# Patient Record
Sex: Female | Born: 1990 | Race: Black or African American | Hispanic: No | Marital: Single | Smoking: Former smoker
Health system: Southern US, Community
[De-identification: ages and names within clinical notes are randomized; demographics above are authoritative.]

## PROBLEM LIST (undated history)

## (undated) DIAGNOSIS — D219 Benign neoplasm of connective and other soft tissue, unspecified: Secondary | ICD-10-CM

## (undated) DIAGNOSIS — Z789 Other specified health status: Secondary | ICD-10-CM

## (undated) HISTORY — PX: NO PAST SURGERIES: SHX2092

## (undated) HISTORY — DX: Benign neoplasm of connective and other soft tissue, unspecified: D21.9

---
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
## 2011-11-19 ENCOUNTER — Emergency Department (HOSPITAL_COMMUNITY)
Admission: EM | Admit: 2011-11-19 | Discharge: 2011-11-19 | Disposition: A | Discharge status: Home / Self Care | Payer: Self-pay | Attending: Emergency Medicine | Admitting: Emergency Medicine

## 2011-11-19 ENCOUNTER — Encounter (HOSPITAL_COMMUNITY): Payer: Self-pay | Admitting: *Deleted

## 2011-11-19 DIAGNOSIS — K5900 Constipation, unspecified: Secondary | ICD-10-CM | POA: Insufficient documentation

## 2011-11-19 LAB — URINALYSIS, ROUTINE W REFLEX MICROSCOPIC
Glucose, UA: NEGATIVE mg/dL
Ketones, ur: NEGATIVE mg/dL
Nitrite: NEGATIVE
Urobilinogen, UA: 1 mg/dL (ref 0.0–1.0)

## 2011-11-19 NOTE — ED Notes (Signed)
She drank a bottle of mag citrate yesterday and again today to cleanse her system.  She was told by her mother to call poison control and they sent her here.  She had a burning in her throat .  No abd pain

## 2011-11-19 NOTE — ED Notes (Signed)
Pt reports after the second dose of mag citrate, she started feeling weak and her throat started burning.  Pt reports that her speech started slurring and starting having watery stools.

## 2011-11-19 NOTE — Discharge Instructions (Signed)
Please read over the instructions below. There is no danger in the amount of magnesium citrate you took for your constipation. Consult with your pharmacist regarding over-the-counter remedies for constipation. Increase fruits and vegetables and be sure to drink plenty of water every day. If this is a intermittent problem for you may consider taking a daily stool softener like Colace or its generic equivalent. You can have up to 3 stool softeners a day. Refer to "[facility]" number above to assist you in getting established with a primary care physician. If your constipation persists you will need to arrange follow up with a primary care physician.    Constipation Constipation is when you poop (have a bowel movement) less than 3 times a week. Sometimes the poop (stool) is small and hard. There are many different causes of constipation.  HOME CARE   Go to the bathroom when you feel the urge to go.   Drink enough fluid to keep your pee (urine) clear or pale yellow.   Eat more high-fiber foods.   Drink prune juice or eat stewed fruits in the morning.   Exercise.   Ask your doctor if you should take medicine to help you poop or take fiber supplements.  GET HELP RIGHT AWAY IF:   You see blood in your poop or in the toilet.   Your belly (abdomen) gets hard and puffy (swollen).   You keep throwing up (vomiting).   You have a lot of pain.  MAKE SURE YOU:   Understand these instructions.   Will watch your condition.   Will get help right away if you are not doing well or get worse.  Document Released: 02/06/2008 Document Revised: 08/09/2011 Document Reviewed: 07/24/2011 [facility] Patient Information 2012 [place], [place].

## 2011-11-20 NOTE — ED Provider Notes (Signed)
History     CSN: [id number]  Arrival date & time 11/19/11  0043   First MD Initiated Contact with Patient 11/19/11 0141      Chief Complaint  Patient presents with  . ingestion mag citrate      Patient is a 21 y.o. female presenting with Ingested Medication. The history is provided by a parent.  Ingestion This is a new problem. The current episode started yesterday. The problem has been unchanged. Pertinent negatives include no chills, congestion, coughing, fever, rash, vomiting or weakness. She has tried nothing for the symptoms.   patient reports that she took an entire bottle of mag citrate on Saturday and then again on Sunday for persistent constipation. When her mother found out she insists that she call the [facility] and she felt like this was a possible overdose of magnesium citrate. When she called poison control they went her list of symptoms and told her she needed to come in to be evaluated. Patient states they told her she sounded as if she was slurring her speech the patient denies. Patient denies any other complaints.  History reviewed. No pertinent past medical history.  History reviewed. No pertinent past surgical history.  History reviewed. No pertinent family history.  History  Substance Use Topics  . Smoking status: Never Smoker   . Smokeless tobacco: Not on file  . Alcohol Use: No    OB History    Grav Para Term Preterm Abortions TAB SAB Ect Mult Living                  Review of Systems  Constitutional: Negative for fever and chills.  HENT: Negative.  Negative for congestion.   Eyes: Negative.   Respiratory: Negative for cough.   Cardiovascular: Negative.   Gastrointestinal: Negative.  Negative for vomiting.  Genitourinary: Negative.   Musculoskeletal: Negative.   Skin: Negative.  Negative for rash.  Neurological: Negative.  Negative for weakness.  Hematological: Negative.   Psychiatric/Behavioral: Negative.     Allergies  Review  of patient's allergies indicates no known allergies.  Home Medications   Current Outpatient Rx  Name Route Sig Dispense Refill  . MAGNESIUM CITRATE PO Oral Take 296 mLs by mouth daily as needed. For laxative      BP 114/72  Pulse 68  Temp(Src) 98.4 F (36.9 C) (Oral)  Resp 18  SpO2 100%  LMP 11/07/2011  Physical Exam  Constitutional: She is oriented to person, place, and time. She appears well-developed and well-nourished. She is active and cooperative.  Non-toxic appearance. She does not have a sickly appearance. She does not appear ill. No distress.  HENT:  Head: Normocephalic and atraumatic.  Right Ear: Hearing, tympanic membrane, external ear and ear canal normal.  Left Ear: Hearing, tympanic membrane, external ear and ear canal normal.  Nose: Nose normal.  Mouth/Throat: Uvula is midline.  Eyes: Conjunctivae are normal.  Neck: Neck supple.  Cardiovascular: Normal rate and regular rhythm.   Pulmonary/Chest: Effort normal and breath sounds normal.  Abdominal: Soft. Bowel sounds are normal.  Musculoskeletal: Normal range of motion.  Neurological: She is alert and oriented to person, place, and time.  Skin: Skin is warm and dry. No rash noted. No erythema.  Psychiatric: She has a normal mood and affect.    ED Course  Procedures  I have called poison control to ask why they recommended that patient come in for ingesting 2 bottles of mag magnesium citrate 24 hours apart. I spoke  with the representative who actually spoke with the patient and she stated that the patient sounded as if she were slurring her words and she was concerned that maybe the patient had taken something else although patient didn't when asked. Agrees that there is no concern for the 2 bottles of mag citrate.  I discussed my conversation with poison control with the patient she agrees that that was the nature of the conversation, but denies that she had weakness or that she was slurring her speech.Denies  SI/HI.  Patient to continues to deny any physical complaints other than her intermittent constipation. I have discussed strategies with her regarding how to manage constipation at home. Will plan to discharge home. Patient agreeable with plan. I have discussed with the patient and the conversation with poison control it with Dr. Bebe Shaggy who is in agreement with my plan to discharge patient home.   Labs Reviewed  URINALYSIS, ROUTINE W REFLEX MICROSCOPIC - Abnormal; Notable for the following:    APPearance CLOUDY (*)    All other components within normal limits  POCT PREGNANCY, URINE  LAB REPORT - SCANNED  URINE MICROSCOPIC-ADD ON   No results found.   1. Constipation       MDM  HPI/PE and clinical findings course c/w 1. Constipation (patient has been observed here for 2-3 hours. There have been no symptoms that would suggest ingestion of any concerning materials. Patient has remained awake alert and oriented and continues to deny any physical complaints)        Leanne Chang, NP 11/20/11 2245

## 2011-11-21 NOTE — ED Provider Notes (Signed)
Medical screening examination/treatment/procedure(s) were performed by non-physician practitioner and as supervising physician I was immediately available for consultation/collaboration.   Joya Gaskins, MD 11/21/11 1525

## 2012-01-07 ENCOUNTER — Encounter (HOSPITAL_COMMUNITY): Payer: Self-pay | Admitting: Emergency Medicine

## 2012-01-07 ENCOUNTER — Emergency Department (HOSPITAL_COMMUNITY)
Admission: EM | Admit: 2012-01-07 | Discharge: 2012-01-07 | Disposition: A | Discharge status: Home / Self Care | Payer: Self-pay | Attending: Emergency Medicine | Admitting: Emergency Medicine

## 2012-01-07 DIAGNOSIS — N946 Dysmenorrhea, unspecified: Secondary | ICD-10-CM | POA: Insufficient documentation

## 2012-01-07 DIAGNOSIS — R109 Unspecified abdominal pain: Secondary | ICD-10-CM | POA: Insufficient documentation

## 2012-01-07 LAB — WET PREP, GENITAL
Clue Cells Wet Prep HPF POC: NONE SEEN
Trich, Wet Prep: NONE SEEN

## 2012-01-07 LAB — URINALYSIS, ROUTINE W REFLEX MICROSCOPIC
Bilirubin Urine: NEGATIVE
Hgb urine dipstick: NEGATIVE
Ketones, ur: 15 mg/dL — AB
Protein, ur: NEGATIVE mg/dL
Specific Gravity, Urine: 1.028 (ref 1.005–1.030)
pH: 7 (ref 5.0–8.0)

## 2012-01-07 LAB — POCT PREGNANCY, URINE: Preg Test, Ur: NEGATIVE

## 2012-01-07 NOTE — ED Provider Notes (Signed)
This chart was scribed for Ashley Sprout, MD by Williemae Natter. The patient was seen in room STRE1/STRE1 at 3:46 PM.  History     CSN: [id number]  Arrival date & time 01/07/12  1441   First MD Initiated Contact with Patient 01/07/12 1539      Chief Complaint  Patient presents with  . Abdominal Pain    (Consider location/radiation/quality/duration/timing/severity/associated sxs/prior treatment) The history is provided by the patient.   Ashley Vega is a 21 y.o. female who presents to the Emergency Department complaining of lower abdominal pain for about 1 year. Pain has gotten worse in the past few months. Pain usually follows MP's and lasts for 1-2 days.. No abnormal vaginal discharge. Pt has heavy regular MP's that last 5-7 days.she does not take birth control.  History reviewed. No pertinent past medical history.  History reviewed. No pertinent past surgical history.  History reviewed. No pertinent family history.  History  Substance Use Topics  . Smoking status: Never Smoker   . Smokeless tobacco: Not on file  . Alcohol Use: No    OB History    Grav Para Term Preterm Abortions TAB SAB Ect Mult Living                  Review of Systems  Constitutional: Negative for fever and chills.  Respiratory: Negative for shortness of breath.   Gastrointestinal: Positive for abdominal pain. Negative for nausea and vomiting.  Neurological: Negative for weakness.  All other systems reviewed and are negative.    Allergies  Review of patient's allergies indicates no known allergies.  Home Medications  No current outpatient prescriptions on file.  BP 138/75  Pulse 64  Temp(Src) 97.7 F (36.5 C) (Oral)  Resp 16  SpO2 99%  Physical Exam  Nursing note and vitals reviewed. Constitutional: She is oriented to person, place, and time. She appears well-developed and well-nourished. No distress.  HENT:  Head: Normocephalic and atraumatic.  Mouth/Throat: Oropharynx is clear  and moist.  Eyes: EOM are normal.  Neck: Neck supple.  Pulmonary/Chest: Effort normal. No respiratory distress.  Abdominal: Normal appearance. There is tenderness (suprapubic tenderness) in the suprapubic area.  Genitourinary: Vagina normal and uterus normal. Cervix exhibits no motion tenderness, no discharge and no friability. Right adnexum displays no mass and no tenderness. Left adnexum displays no mass and no tenderness.  Musculoskeletal: Normal range of motion.  Neurological: She is alert and oriented to person, place, and time.  Skin: Skin is warm and dry.  Psychiatric: She has a normal mood and affect. Her behavior is normal.    ED Course  Procedures (including critical care time)  Labs Reviewed  URINALYSIS, ROUTINE W REFLEX MICROSCOPIC - Abnormal; Notable for the following:    Ketones, ur 15 (*)    All other components within normal limits  WET PREP, GENITAL - Abnormal; Notable for the following:    WBC, Wet Prep HPF POC FEW (*)    All other components within normal limits  POCT PREGNANCY, URINE  GC/CHLAMYDIA PROBE AMP, GENITAL   No results found.   No diagnosis found.    MDM   Patient here with worsening abdominal pain after monthly menses. UA and UPT within normal limits. Pelvic exam unrevealing other than some mild vaginal discharge. She has no cervical motion tenderness or adnexal tenderness. Wet prep is within normal limits she denies any new sexual partners or vaginal discharge, itching or dysuria. Will have her followup with OB/GYN for further evaluation. All  these results were explained with the patient I personally performed the services described in this documentation, which was scribed in my presence.  The recorded information has been reviewed and considered.      Ashley Sprout, MD 01/07/12 [phone]

## 2012-01-07 NOTE — Discharge Instructions (Signed)
Dysmenorrhea Menstrual pain is caused by the muscles of the uterus tightening (contracting) during a menstrual period. The muscles of the uterus contract due to the chemicals in the uterine lining. Primary dysmenorrhea is menstrual cramps that last a couple of days when you start having menstrual periods or soon after. This often begins after a teenager starts having her period. As a woman gets older or has a baby, the cramps will usually lesson or disappear. Secondary dysmenorrhea begins later in life, lasts longer, and the pain may be stronger than primary dysmenorrhea. The pain may start before the period and last a few days after the period. This type of dysmenorrhea is usually caused by an underlying problem such as:  The tissue lining the uterus grows outside of the uterus in other areas of the body (endometriosis).   The endometrial tissue, which normally lines the uterus, is found in or grows into the muscular walls of the uterus (adenomyosis).   The pelvic blood vessels are engorged with blood just before the menstrual period (pelvic congestive syndrome).   Overgrowth of cells in the lining of the uterus or cervix (polyps of the uterus or cervix).   Falling down of the uterus (prolapse) because of loose or stretched ligaments.   Depression.   Bladder problems, infection, or inflammation.   Problems with the intestine, a tumor, or irritable bowel syndrome.   Cancer of the female organs or bladder.   A severely tipped uterus.   A very tight opening or closed cervix.   Noncancerous tumors of the uterus (fibroids).   Pelvic inflammatory disease (PID).   Pelvic scarring (adhesions) from a previous surgery.   Ovarian cyst.   An intrauterine device (IUD) used for birth control.  CAUSES  The cause of menstrual pain is often unknown. SYMPTOMS   Cramping or throbbing pain in your lower abdomen.   Sometimes, a woman may also experience headaches.   Lower back pain.    Feeling sick to your stomach (nausea) or vomiting.   Diarrhea.   Sweating or dizziness.  DIAGNOSIS  A diagnosis is based on your history, symptoms, physical examination, diagnostic tests, or procedures. Diagnostic tests or procedures may include:  Blood tests.   An ultrasound.   An examination of the lining of the uterus (dilation and curettage, D&C).   An examination inside your abdomen or pelvis with a scope (laparoscopy).   X-rays.   CT Scan.   MRI.   An examination inside the bladder with a scope (cystoscopy).   An examination inside the intestine or stomach with a scope (colonoscopy, gastroscopy).  TREATMENT  Treatment depends on the cause of the dysmenorrhea. Treatment may include:  Pain medicine prescribed by your caregiver.   Birth control pills.   Hormone replacement therapy.   Nonsteroidal anti-inflammatory drugs (NSAIDs). These may help stop the production of prostaglandins.   An IUD with progesterone hormone in it.   Acupuncture.   Surgery to remove adhesions, endometriosis, ovarian cyst, or fibroids.   Removal of the uterus (hysterectomy).   Progesterone shots to stop the menstrual period.   Cutting the nerves on the sacrum that go to the female organs (presacral neurectomy).   Electric currant to the sacral nerves (sacral nerve stimulation).   Antidepressant medicine.   Psychiatric therapy, counseling, or group therapy.   Exercise and physical therapy.   Meditation and yoga therapy.  HOME CARE INSTRUCTIONS   Only take over-the-counter or prescription medicines for pain, discomfort, or fever as directed by your   caregiver.   Place a heating pad or hot water bottle on your lower back or abdomen. Do not sleep with the heating pad.   Use aerobic exercises, walking, swimming, biking, and other exercises to help lessen the cramping.   Massage to the lower back or abdomen may help.   Stop smoking.   Avoid alcohol and caffeine.   Yoga,  meditation, or acupuncture may help.  SEEK MEDICAL CARE IF:   The pain does not get better with medicine.   You have pain with sexual intercourse.  SEEK IMMEDIATE MEDICAL CARE IF:   Your pain increases and is not controlled with medicines.   You have a fever.   You develop nausea or vomiting with your period not controlled with medicine.   You have abnormal vaginal bleeding with your period.   You pass out.  MAKE SURE YOU:   Understand these instructions.   Will watch your condition.   Will get help right away if you are not doing well or get worse.  Document Released: 08/20/2005 Document Revised: 08/09/2011 Document Reviewed: 12/06/2008 [employer] Patient Information 2012 [employer]. 

## 2012-01-07 NOTE — ED Notes (Signed)
Pt c/o lower abd pain with bloating after finishing period x several months in a row; pt denies vaginal discharge

## 2012-07-10 ENCOUNTER — Emergency Department (HOSPITAL_COMMUNITY)
Admission: EM | Admit: 2012-07-10 | Discharge: 2012-07-10 | Disposition: A | Discharge status: Home / Self Care | Payer: Self-pay | Attending: Emergency Medicine | Admitting: Emergency Medicine

## 2012-07-10 ENCOUNTER — Emergency Department (HOSPITAL_COMMUNITY): Payer: Self-pay

## 2012-07-10 ENCOUNTER — Encounter (HOSPITAL_COMMUNITY): Payer: Self-pay | Admitting: Nurse Practitioner

## 2012-07-10 DIAGNOSIS — F17200 Nicotine dependence, unspecified, uncomplicated: Secondary | ICD-10-CM | POA: Insufficient documentation

## 2012-07-10 DIAGNOSIS — O039 Complete or unspecified spontaneous abortion without complication: Secondary | ICD-10-CM | POA: Insufficient documentation

## 2012-07-10 LAB — CBC
HCT: 35.6 % — ABNORMAL LOW (ref 36.0–46.0)
MCH: 22.8 pg — ABNORMAL LOW (ref 26.0–34.0)
MCHC: 30.9 g/dL (ref 30.0–36.0)
MCV: 73.7 fL — ABNORMAL LOW (ref 78.0–100.0)

## 2012-07-10 LAB — WET PREP, GENITAL: Trich, Wet Prep: NONE SEEN

## 2012-07-10 LAB — PREGNANCY, URINE: Preg Test, Ur: POSITIVE — AB

## 2012-07-10 LAB — HCG, QUANTITATIVE, PREGNANCY: hCG, Beta Chain, Quant, S: 3541 m[IU]/mL — ABNORMAL HIGH (ref <5)

## 2012-07-10 LAB — ABO/RH: ABO/RH(D): O POS

## 2012-07-10 MED ORDER — IBUPROFEN 800 MG PO TABS: 800.0000 mg | ORAL_TABLET | Freq: Three times a day (TID) | ORAL | Status: DC

## 2012-07-10 MED ORDER — ACETAMINOPHEN 325 MG PO TABS
650.0000 mg | ORAL_TABLET | Freq: Once | ORAL | Status: AC
  Administered 2012-07-10: 650 mg via ORAL
  Filled 2012-07-10: qty 2

## 2012-07-10 MED ORDER — MISOPROSTOL 200 MCG PO TABS
800.0000 ug | ORAL_TABLET | ORAL | Status: AC
  Administered 2012-07-10: 800 ug via RECTAL
  Filled 2012-07-10: qty 4

## 2012-07-10 NOTE — ED Notes (Signed)
Pt changed into gown and ready to be seen by provider 

## 2012-07-10 NOTE — Progress Notes (Signed)
Responded to page to be with pt. who was alone and needed emotional and pastoral support. Pt. was  [redacted] weeks along in her pregnancy. Pt.spontaneously suffered miscarriage and the loss of baby. Pt. initially did not want to talk but as I sat with her she began to share her feeling of loss and need.  Pt. struggling with faith, guilt, being judged and fear. I listened and  provided a ministry of presence and a framework for pt. to express and deal with her feelings. Pt only trusted support was her boyfriend who had to leave her to go to work..  Pt. Was later discharged.    07/10/12 2200  Clinical Encounter Type  Visited With Patient;Health care provider  Visit Type Spiritual support;ED  Referral From Nurse  Spiritual Encounters  Spiritual Needs Prayer;Emotional;Grief support  Stress Factors  Patient Stress Factors Exhausted;Loss

## 2012-07-10 NOTE — ED Provider Notes (Signed)
History     CSN: [id number]  Arrival date & time 07/10/12  1342   First MD Initiated Contact with Patient 07/10/12 1601      Chief Complaint  Patient presents with  . Vaginal Bleeding    (Consider location/radiation/quality/duration/timing/severity/associated sxs/prior treatment) HPI Comments: Patient comes in today with a chief complaint of pelvic pain and vaginal bleeding.  She describes the pelvic pain as intense cramping.  Symptoms started yesterday and have worsened today.  She has not taken anything for pain.  Yesterday she noticed a large bloody piece of tissue in the toilet when she urinated.  She reports that she is [redacted] weeks pregnant.  She had an ultrasound done by [facility] two weeks ago, which she reports showed that everything was normal.  She is G1P0.  She denies nausea or vomiting.  Denies dizziness or lightheadedness.  She does not have an OB/GYN.  The history is provided by the patient.    History reviewed. No pertinent past medical history.  History reviewed. No pertinent past surgical history.  History reviewed. No pertinent family history.  History  Substance Use Topics  . Smoking status: Current Some Day Smoker  . Smokeless tobacco: Not on file  . Alcohol Use: No    OB History    Grav Para Term Preterm Abortions TAB SAB Ect Mult Living                  Review of Systems  Constitutional: Negative for fever and chills.  Gastrointestinal: Positive for abdominal pain. Negative for nausea and vomiting.  Genitourinary: Positive for vaginal bleeding and pelvic pain. Negative for dysuria, urgency, frequency and vaginal discharge.  Neurological: Negative for dizziness, syncope and light-headedness.  All other systems reviewed and are negative.    Allergies  Review of patient's allergies indicates no known allergies.  Home Medications  No current outpatient prescriptions on file.  BP 136/76  Pulse 66  Temp 98.5 F (36.9 C) (Oral)  Resp  16  SpO2 98%  Physical Exam  Nursing note and vitals reviewed. Constitutional: She appears well-developed and well-nourished. No distress.  HENT:  Head: Normocephalic and atraumatic.  Mouth/Throat: Oropharynx is clear and moist.  Cardiovascular: Normal rate, regular rhythm and normal heart sounds.   Pulmonary/Chest: Effort normal and breath sounds normal.  Abdominal: Soft. She exhibits no distension and no mass. There is tenderness in the right lower quadrant, suprapubic area and left lower quadrant. There is no rigidity, no rebound and no guarding.  Genitourinary: Vagina normal. Cervix exhibits no motion tenderness. Right adnexum displays no mass and no tenderness. Left adnexum displays no mass and no tenderness.       Large amount of blood in the vaginal vault.  Cervical os appears to be open  Neurological: She is alert.  Skin: Skin is warm and dry. She is not diaphoretic.  Psychiatric: She has a normal mood and affect.    ED Course  Procedures (including critical care time)  Labs Reviewed  PREGNANCY, URINE - Abnormal; Notable for the following:    Preg Test, Ur POSITIVE (*)     All other components within normal limits  HCG, QUANTITATIVE, PREGNANCY - Abnormal; Notable for the following:    hCG, Beta Chain, Quant, S 3541 (*)     All other components within normal limits  CBC  GC/CHLAMYDIA PROBE AMP  WET PREP, GENITAL  ABO/RH   US Ob Comp Less 14 Wks  07/10/2012  *RADIOLOGY REPORT*  Clinical Data:  21 year old G1, LMP 05/18/2012 (7 weeks 4 days), quantitative beta HCG 3541, presenting with vaginal bleeding and cramping.  The patient has been passing clots for the past 2 days.  OBSTETRIC <14 WK [place] AND TRANSVAGINAL OB US  Technique:  Both transabdominal and transvaginal ultrasound examinations were performed for complete evaluation of the gestation as well as the maternal uterus, adnexal regions, and pelvic cul-de-sac.  Transvaginal technique was performed to assess early pregnancy.   Comparison:  None.  Intrauterine gestational sac:  Not visualized. Yolk sac: Not visualized. Embryo: Not visualized. Cardiac Activity: Not visualized. Heart Rate: Not applicable.  MSD: Not applicable.  Maternal uterus/adnexae: Thickened, heterogeneous endometrium containing echogenic fluid, measuring up to 1.9 cm in the lower uterine segment.  No visible color Doppler flow within the endometrial contents.  Normal-appearing ovaries bilaterally, the right measuring approximately 2.3 x 1.3 x 1.9 cm and the left measuring approximately 3.1 x 1.3 x 2.2 cm.  Trace free fluid in the cul-de- sac.  IMPRESSION:  1.  Findings consistent with a spontaneous abortion in progress. 2.  Echogenic material within the thickened endometrium consistent with blood clot, as there is no color Doppler flow within the material to confirm retained products of conception. 3.  Normal-appearing ovaries.   Original Report Authenticated By: Hulan Saas, M.D.    US Ob Transvaginal  07/10/2012  *RADIOLOGY REPORT*  Clinical Data: 21 year old G1, LMP 05/18/2012 (7 weeks 4 days), quantitative beta HCG 3541, presenting with vaginal bleeding and cramping.  The patient has been passing clots for the past 2 days.  OBSTETRIC <14 WK [place] AND TRANSVAGINAL OB US  Technique:  Both transabdominal and transvaginal ultrasound examinations were performed for complete evaluation of the gestation as well as the maternal uterus, adnexal regions, and pelvic cul-de-sac.  Transvaginal technique was performed to assess early pregnancy.  Comparison:  None.  Intrauterine gestational sac:  Not visualized. Yolk sac: Not visualized. Embryo: Not visualized. Cardiac Activity: Not visualized. Heart Rate: Not applicable.  MSD: Not applicable.  Maternal uterus/adnexae: Thickened, heterogeneous endometrium containing echogenic fluid, measuring up to 1.9 cm in the lower uterine segment.  No visible color Doppler flow within the endometrial contents.  Normal-appearing ovaries  bilaterally, the right measuring approximately 2.3 x 1.3 x 1.9 cm and the left measuring approximately 3.1 x 1.3 x 2.2 cm.  Trace free fluid in the cul-de- sac.  IMPRESSION:  1.  Findings consistent with a spontaneous abortion in progress. 2.  Echogenic material within the thickened endometrium consistent with blood clot, as there is no color Doppler flow within the material to confirm retained products of conception. 3.  Normal-appearing ovaries.   Original Report Authenticated By: Hulan Saas, M.D.      No diagnosis found.  6:07 PM Discussed results of the Ultrasound with the patient.  I have paged OB/GYN on call for recommendations and follow up. 7:31 PM Discussed with Dr. Dolan Amen the OB/GYN physician on call.  She recommends giving the patient Cytotec 800 mcg rectally and then having her follow up in 2 weeks.  She will follow up with the patient.  MDM  Patient who is [redacted] weeks pregnant presents today with a chief complaint of vaginal bleeding and pelvic pain. Patient is Rh negative.  Beta hCG is 3541, which is low for a pregnancy at 8 weeks.  Pelvic exam showed an open cervical os.   VSS.  Labs unremarkable.  Consulted OB/GYN on call.  She recommended giving the patient 800 mcg Cytotec rectally and having  the patient follow up with her in 2 weeks.  Cytotec given in the ED and patient discharged home.  Return precautions given to patient.          Pascal Lux [place], PA-C 07/11/12 2255

## 2012-07-10 NOTE — ED Notes (Signed)
C/o intermittent heavy vag bleeding and pelvic pain since yesterday. Reports yesterday she noticed a large bloody round clot in toilet. [redacted] weeks pregnant confirmed by [place].

## 2012-07-11 LAB — GC/CHLAMYDIA PROBE AMP, GENITAL: Chlamydia, DNA Probe: NEGATIVE

## 2012-07-12 NOTE — ED Provider Notes (Signed)
Medical screening examination/treatment/procedure(s) were performed by non-physician practitioner and as supervising physician I was immediately available for consultation/collaboration.    Belicia Difatta R Hermela Hardt, MD 07/12/12 1559 

## 2012-07-13 ENCOUNTER — Inpatient Hospital Stay (HOSPITAL_COMMUNITY): Payer: Self-pay

## 2012-07-13 ENCOUNTER — Inpatient Hospital Stay (HOSPITAL_COMMUNITY)
Admission: AD | Admit: 2012-07-13 | Discharge: 2012-07-13 | Disposition: A | Discharge status: Home / Self Care | Payer: Self-pay | Source: Ambulatory Visit | Attending: Obstetrics & Gynecology | Admitting: Obstetrics & Gynecology

## 2012-07-13 ENCOUNTER — Encounter (HOSPITAL_COMMUNITY): Payer: Self-pay | Admitting: Advanced Practice Midwife

## 2012-07-13 DIAGNOSIS — O034 Incomplete spontaneous abortion without complication: Secondary | ICD-10-CM

## 2012-07-13 HISTORY — DX: Other specified health status: Z78.9

## 2012-07-13 LAB — CBC
HCT: 33.9 % — ABNORMAL LOW (ref 36.0–46.0)
Hemoglobin: 10.4 g/dL — ABNORMAL LOW (ref 12.0–15.0)
RDW: 16.8 % — ABNORMAL HIGH (ref 11.5–15.5)
WBC: 6.6 10*3/uL (ref 4.0–10.5)

## 2012-07-13 MED ORDER — KETOROLAC TROMETHAMINE 10 MG PO TABS: 10.0000 mg | ORAL_TABLET | Freq: Four times a day (QID) | ORAL | Status: DC | PRN

## 2012-07-13 NOTE — MAU Note (Signed)
Was seen at [facility] ED 11/7 and dx with SAB. Given cytotec rectally. Awoke this am at 0400 with sharp pains in abdomen and vag bleeding

## 2012-07-13 NOTE — MAU Provider Note (Signed)
Chief Complaint: No chief complaint on file.  First Provider Initiated Contact with Patient 07/13/12 [phone]      SUBJECTIVE HPI: Ashley Vega is a 21 y.o. G1P0010 at 3 days S/P SAB who presents with moderate to severe, cramping and continued moderate to heavy bleeding. Seen in [facility] and Dx w/ SAB. Given Cytotec per consult w/ Dr, Erin Fulling. States normal IUP seen on [place] at [facility] a few weeks ago. Quant 3541 07/10/12. Denies dizziness, fever, soaking a pad an hour, urinary complaints or GI complaints.   No past medical history on file. OB History    Grav Para Term Preterm Abortions TAB SAB Ect Mult Living   1    1  1    0     # Outc Date GA Lbr Len/2nd Wgt Sex Del Anes PTL Lv   1 SAB              No past surgical history on file. History   Social History  . Marital Status: Single    Spouse Name: N/A    Number of Children: N/A  . Years of Education: N/A   Occupational History  . Not on file.   Social History Main Topics  . Smoking status: Current Some Day Smoker  . Smokeless tobacco: Not on file  . Alcohol Use: No  . Drug Use: No  . Sexually Active:    Other Topics Concern  . Not on file   Social History Narrative  . No narrative on file   No current facility-administered medications on file prior to encounter.   Current Outpatient Prescriptions on File Prior to Encounter  Medication Sig Dispense Refill  . ibuprofen (ADVIL,MOTRIN) 800 MG tablet Take 1 tablet (800 mg total) by mouth 3 (three) times daily.  21 tablet  0   No Known Allergies  ROS: Pertinent items in HPI  OBJECTIVE There were no vitals taken for this visit. GENERAL: Well-developed, well-nourished female in mild distress.  HEENT: Normocephalic HEART: normal rate RESP: normal effort ABDOMEN: Soft, non-tender.  EXTREMITIES: Nontender, no edema NEURO: Alert and oriented SPECULUM EXAM: NEFG, moderate amount of blood noted, small fragment of tissue removed from cervix w/ ring forceps.  cervix clean. Small amount of active bleeding. BIMANUAL: cervix FT; uterus normal size, no adnexal tenderness or masses. No CMT  LAB RESULTS Results for orders placed during the hospital encounter of 07/13/12 (from the past 24 hour(s))  HCG, QUANTITATIVE, PREGNANCY     Status: Abnormal   Collection Time   07/13/12  7:10 AM      Component Value Range   hCG, Beta Chain, Quant, S 772 (*) <5 mIU/mL  CBC     Status: Abnormal   Collection Time   07/13/12  7:16 AM      Component Value Range   WBC 6.6  4.0 - 10.5 K/uL   RBC 4.54  3.87 - 5.11 MIL/uL   Hemoglobin 10.4 (*) 12.0 - 15.0 g/dL   HCT 96.0 (*) 45.4 - 09.8 %   MCV 74.7 (*) 78.0 - 100.0 fL   MCH 22.9 (*) 26.0 - 34.0 pg   MCHC 30.7  30.0 - 36.0 g/dL   RDW 11.9 (*) 14.7 - 82.9 %   Platelets 261  150 - 400 K/uL    IMAGING US Ob Transvaginal  07/10/2012  *RADIOLOGY REPORT*  Clinical Data: 21 year old G1, LMP 05/18/2012 (7 weeks 4 days), quantitative beta HCG 3541, presenting with vaginal bleeding and cramping.  The patient has  been passing clots for the past 2 days.  OBSTETRIC <14 WK [place] AND TRANSVAGINAL OB US  Technique:  Both transabdominal and transvaginal ultrasound examinations were performed for complete evaluation of the gestation as well as the maternal uterus, adnexal regions, and pelvic cul-de-sac.  Transvaginal technique was performed to assess early pregnancy.  Comparison:  None.  Intrauterine gestational sac:  Not visualized. Yolk sac: Not visualized. Embryo: Not visualized. Cardiac Activity: Not visualized. Heart Rate: Not applicable.  MSD: Not applicable.  Maternal uterus/adnexae: Thickened, heterogeneous endometrium containing echogenic fluid, measuring up to 1.9 cm in the lower uterine segment.  No visible color Doppler flow within the endometrial contents.  Normal-appearing ovaries bilaterally, the right measuring approximately 2.3 x 1.3 x 1.9 cm and the left measuring approximately 3.1 x 1.3 x 2.2 cm.  Trace free fluid in the  cul-de- sac.  IMPRESSION:  1.  Findings consistent with a spontaneous abortion in progress. 2.  Echogenic material within the thickened endometrium consistent with blood clot, as there is no color Doppler flow within the material to confirm retained products of conception. 3.  Normal-appearing ovaries.   Original Report Authenticated By: Hulan Saas, M.D.    US Transvaginal Non-ob  07/13/2012  *RADIOLOGY REPORT*  Clinical Data: 3 days status post spontaneous abortion and cytotec. Severe cramping and bleeding.  Rule out products of conception. LMP 05/18/2012.  TRANSVAGINAL ULTRASOUND OF PELVIS  Technique:  Transvaginal ultrasound examination of the pelvis was performed including evaluation of the uterus, ovaries, adnexal regions, and pelvic cul-de-sac.  Comparison:  07/10/2012  Findings:  Uterus:  9.0 x 5.4 x 5.3 cm.  Endometrium: Thickened, heterogeneous.  Endometrium measures 16.3 mm in thickness and does demonstrate blood flow on Doppler evaluation.  Right ovary: 3.2 x 1.6 x 2.1 cm, normal in appearance.  Left ovary: 3.6 x 1.3 x 1.5 cm, normal in appearance.  Other Findings:  No free fluid  IMPRESSION:  1.  Heterogeneous, thickened endometrium, suspicious for retained products of conception. 2.  No evidence for adnexal mass or free pelvic fluid.   Original Report Authenticated By: Norva Pavlov, M.D.     MAU COURSE Care of pt turned over to Thressa Sheller, CNM at 0800.   0915: Pt offered repeat dose of cytotec. She declines at this time. She would prefer expectant management.  Dorathy Kinsman, CNM 07/13/2012 8:07 AM  ASSESSMENT 1. Incomplete spontaneous abortion without mention of complication    PLAN Discharge home Copy of [place] report requested.   Medication List     As of 07/13/2012  6:35 AM    ASK your doctor about these medications         ibuprofen 800 MG tablet   Commonly known as: ADVIL,MOTRIN   Take 1 tablet (800 mg total) by mouth 3 (three) times daily.

## 2012-07-13 NOTE — Progress Notes (Signed)
Some tissue noted in cervical os. Bimanual done.

## 2012-07-24 ENCOUNTER — Encounter: Payer: Self-pay | Admitting: Advanced Practice Midwife

## 2012-08-20 ENCOUNTER — Encounter: Payer: Self-pay | Admitting: Advanced Practice Midwife

## 2013-02-06 ENCOUNTER — Encounter: Payer: Self-pay | Admitting: Family Medicine

## 2013-02-06 ENCOUNTER — Ambulatory Visit (INDEPENDENT_AMBULATORY_CARE_PROVIDER_SITE_OTHER): Payer: Self-pay | Admitting: Family Medicine

## 2013-02-06 VITALS — BP 117/57 | HR 71 | Temp 98.1°F | Ht 65.0 in | Wt 183.0 lb

## 2013-02-06 DIAGNOSIS — D219 Benign neoplasm of connective and other soft tissue, unspecified: Secondary | ICD-10-CM | POA: Insufficient documentation

## 2013-02-06 DIAGNOSIS — R011 Cardiac murmur, unspecified: Secondary | ICD-10-CM | POA: Insufficient documentation

## 2013-02-06 DIAGNOSIS — D259 Leiomyoma of uterus, unspecified: Secondary | ICD-10-CM

## 2013-02-06 DIAGNOSIS — N926 Irregular menstruation, unspecified: Secondary | ICD-10-CM

## 2013-02-06 NOTE — Patient Instructions (Signed)
Sexually Transmitted Disease  Sexually transmitted disease (STD) refers to any infection that is passed from person to person during sexual activity. This may happen by way of saliva, semen, blood, vaginal mucus, or urine. Common STDs include:   Gonorrhea.   Chlamydia.   Syphilis.   HIV/AIDS.   Genital herpes.   Hepatitis B and C.   Trichomonas.   Human papillomavirus (HPV).   Pubic lice.  CAUSES   An STD may be spread by bacteria, virus, or parasite. A person can get an STD by:   Sexual intercourse with an infected person.   Sharing sex toys with an infected person.   Sharing needles with an infected person.   Having intimate contact with the genitals, mouth, or rectal areas of an infected person.  SYMPTOMS   Some people may not have any symptoms, but they can still pass the infection to others. Different STDs have different symptoms. Symptoms include:   Painful or bloody urination.   Pain in the pelvis, abdomen, vagina, anus, throat, or eyes.   Skin rash, itching, irritation, growths, or sores (lesions). These usually occur in the genital or anal area.   Abnormal vaginal discharge.   Penile discharge in men.   Soft, flesh-colored skin growths in the genital or anal area.   Fever.   Pain or bleeding during sexual intercourse.   Swollen glands in the groin area.   Yellow skin and eyes (jaundice). This is seen with hepatitis.  DIAGNOSIS   To make a diagnosis, your caregiver may:   Take a medical history.   Perform a physical exam.   Take a specimen (culture) to be examined.   Examine a sample of discharge under a microscope.   Perform blood tests.   Perform a Pap test, if this applies.   Perform a colposcopy.   Perform a laparoscopy.  TREATMENT    Chlamydia, gonorrhea, trichomonas, and syphilis can be cured with antibiotic medicine.   Genital herpes, hepatitis, and HIV can be treated, but not cured, with prescribed medicines. The medicines will lessen the symptoms.   Genital warts  from HPV can be treated with medicine or by freezing, burning (electrocautery), or surgery. Warts may come back.   HPV is a virus and cannot be cured with medicine or surgery.However, abnormal areas may be followed very closely by your caregiver and may be removed from the cervix, vagina, or vulva through office procedures or surgery.  If your diagnosis is confirmed, your recent sexual partners need treatment. This is true even if they are symptom-free or have a negative culture or evaluation. They should not have sex until their caregiver says it is okay.  HOME CARE INSTRUCTIONS   All sexual partners should be informed, tested, and treated for all STDs.   Take your antibiotics as directed. Finish them even if you start to feel better.   Only take over-the-counter or prescription medicines for pain, discomfort, or fever as directed by your caregiver.   Rest.   Eat a balanced diet and drink enough fluids to keep your urine clear or pale yellow.   Do not have sex until treatment is completed and you have followed up with your caregiver. STDs should be checked after treatment.   Keep all follow-up appointments, Pap tests, and blood tests as directed by your caregiver.   Only use latex condoms and water-soluble lubricants during sexual activity. Do not use petroleum jelly or oils.   Avoid alcohol and illegal drugs.   Get vaccinated   dams (for oral sex) will help lessen the risk of getting an STD, but will not completely eliminate the risk. SEEK MEDICAL CARE IF:   You have a fever.  You have any new or worsening symptoms. Document Released: 11/10/2002 Document Revised: 11/12/2011 Document Reviewed:  11/17/2010 [facility] Patient Information 2014 [place], [place].   It was a pleasure meeting you today. I enjoyed talking with you and getting to know you better. Please fell free to make an appointment if you become ill or have any concerns.

## 2013-02-06 NOTE — Assessment & Plan Note (Signed)
-   Patient with history of irregular menses.  - Discussed BC use for regular menses. Patient is not open to starting medications currently, but is thinking about for the future. She is not interested in starting anything that is has the possibility of making her gain weight.  - Discussed STD and safe sex practices. Also gave handout on STD.  - Discussed PAP smear after 22 years old  - F/U: as need

## 2013-02-06 NOTE — Progress Notes (Signed)
Subjective:     Patient ID: Ashley Vega, female   DOB: 1990-12-18, 21 y.o.   MRN: [id number]  HPI Re- establishment of care: Han reports she has no complaints or concerns today. She is attempting to get re-established into the clinic. Her last LMP was "sometime last month." She does not have concerns of pregnancy because she is currently not sexually active and is not ina relationship. Her periods have never been regular, and she experiences 1-2 days of heavy bleeding and then 5 days of regular bleeding. She reports he has "extreme" cramps the first day of her period that makes it difficult for her to get out of bed. She is currently not interested in birth control methods, but may want to re-visit this subject later to regulate her periods. She experienced a miscarriage at [redacted] weeks gestation last November. She is worried that something is wrong with her body that would prevent her from having a baby in the future. She was seen at the [facility] at [facility] hospital for her care during this time. She also reports her boyfriend has broke up with her  A few month ago, and they had been dating for 2 1/2 years. She reports she was upset about this, but she is "good" now.   She is active, she enjoys running and reports she eats healthy most days, but eats M&Ms sometimes. She is "pro active" and enjoys life, taking on many projects and responsibilities.  I have reviewed the patients medications, allergies, social, family, surgical and past medical history today.   Review of Systems  Constitutional: Negative for appetite change, fatigue and unexpected weight change.  HENT: Negative for hearing loss, congestion, postnasal drip and ear discharge.   Eyes: Negative for pain and discharge.  Respiratory: Negative for chest tightness and shortness of breath.   Cardiovascular: Negative for chest pain.  Gastrointestinal: Negative for abdominal pain.  Genitourinary: Positive for menstrual problem. Negative for vaginal  discharge and vaginal pain.  Allergic/Immunologic: Negative for environmental allergies and food allergies.  Neurological: Negative for headaches.  Psychiatric/Behavioral: The patient is not nervous/anxious.        Objective:   Physical Exam  Constitutional: She is oriented to person, place, and time. She appears well-developed and well-nourished. No distress.  Pleasant female. Quiet.   HENT:  Head: Normocephalic and atraumatic.  Right Ear: External ear normal.  Left Ear: External ear normal.  Nose: Nose normal.  Mouth/Throat: Oropharynx is clear and moist.  Eyes: EOM are normal. Pupils are equal, round, and reactive to light. Right eye exhibits no discharge. Left eye exhibits no discharge. No scleral icterus.  Neck: Neck supple.  Cardiovascular: Normal rate and regular rhythm.   Murmur heard. Pulmonary/Chest: Effort normal and breath sounds normal.  Abdominal: Soft. Bowel sounds are normal. She exhibits no distension and no mass. There is no tenderness.  Musculoskeletal: She exhibits no edema and no tenderness.  Neurological: She is alert and oriented to person, place, and time. She has normal reflexes. No cranial nerve deficit.  Skin: Skin is warm and dry.  Psychiatric: Judgment and thought content normal.  Mood and affect was normal, however I felt there was something she wanted to talk about was not. No depression or anxiety.    BP 117/57  Pulse 71  Temp(Src) 98.1 F (36.7 C) (Oral)  Ht 5\' 5"  (1.651 m)  Wt 183 lb (83.008 kg)  BMI 30.45 kg/m2

## 2013-02-06 NOTE — Assessment & Plan Note (Signed)
-   Appreciated 2/6 SM today. Patient reports cardiac work up in 2006, with no concerns.  - Will attempt to gain access to these files. - No physical activity barriers per patient - Will monitor and obtain records

## 2013-04-03 ENCOUNTER — Ambulatory Visit: Payer: Self-pay

## 2013-04-22 ENCOUNTER — Ambulatory Visit: Payer: Self-pay | Admitting: Family Medicine

## 2014-07-05 ENCOUNTER — Encounter: Payer: Self-pay | Admitting: Family Medicine

## 2015-06-20 ENCOUNTER — Encounter (HOSPITAL_COMMUNITY): Payer: Self-pay | Admitting: Emergency Medicine

## 2015-06-20 DIAGNOSIS — F419 Anxiety disorder, unspecified: Secondary | ICD-10-CM | POA: Insufficient documentation

## 2015-06-20 DIAGNOSIS — Z86018 Personal history of other benign neoplasm: Secondary | ICD-10-CM | POA: Insufficient documentation

## 2015-06-20 DIAGNOSIS — T43595A Adverse effect of other antipsychotics and neuroleptics, initial encounter: Secondary | ICD-10-CM | POA: Insufficient documentation

## 2015-06-20 DIAGNOSIS — Z720 Tobacco use: Secondary | ICD-10-CM | POA: Insufficient documentation

## 2015-06-20 DIAGNOSIS — Y998 Other external cause status: Secondary | ICD-10-CM | POA: Insufficient documentation

## 2015-06-20 DIAGNOSIS — Y9289 Other specified places as the place of occurrence of the external cause: Secondary | ICD-10-CM | POA: Insufficient documentation

## 2015-06-20 DIAGNOSIS — Y9389 Activity, other specified: Secondary | ICD-10-CM | POA: Insufficient documentation

## 2015-06-20 NOTE — ED Notes (Signed)
Pt was at [facility] [employer].

## 2015-06-20 NOTE — ED Notes (Signed)
Pt reports she was in a mental hospital this week. (thought she was just going there for therapy with her family). Pt was started on Seroquel and Abilify. Today before she left she got in injection of Abilify. Since the injection she has not been feeling right. C/o headache and difficulty seeing far away. Also c.o anxiety.  Denies si/hi, denies hallucinations. Pt keeps stating she doesn't know why she is on this medication- she has no hx of mental illness.

## 2015-06-21 ENCOUNTER — Emergency Department (HOSPITAL_COMMUNITY)
Admission: EM | Admit: 2015-06-21 | Discharge: 2015-06-21 | Disposition: A | Discharge status: Home / Self Care | Payer: Self-pay | Attending: Emergency Medicine | Admitting: Emergency Medicine

## 2015-06-21 DIAGNOSIS — T4395XA Adverse effect of unspecified psychotropic drug, initial encounter: Secondary | ICD-10-CM

## 2015-06-21 NOTE — ED Provider Notes (Signed)
CSN: [id number]     Arrival date & time 06/20/15  1954 History   First MD Initiated Contact with Patient 06/21/15 0044     Chief Complaint  Patient presents with  . Medication Reaction     (Consider location/radiation/quality/duration/timing/severity/associated sxs/prior Treatment) HPI  This is a 24 year old female who presents with possible adverse medication reaction. Patient reports that she received a dose of IM Abilify today while at [facility]. She is unable to tell me why she is taking Abilify. She states "I think I have some mental issues." She states that she developed right sided headache and fullness and felt like "the medication was seeping up into my head." She reported difficulty focusing and anxiety. She states "I was slipping out in the waiting room." She also reports bumps that have since resolved over her face. Denies any other rash. Denies shortness of breath. Reports improvement of her symptoms.  Her headache is 3 out of 10. Reports that she also had difficulty "focusing" earlier today. Felt like she had to strain to see long distances. Denies any vision changes at this time. She is declining medication for her headache. She states "I take too much medication anyway." Denies any SI, HI, or hallucinations.    Past Medical History  Diagnosis Date  . No pertinent past medical history   . Fibroids    Past Surgical History  Procedure Laterality Date  . No past surgeries     Family History  Problem Relation Age of Onset  . Other Neg Hx   . Cancer Maternal Grandmother   . Hypertension Maternal Grandfather   . Kidney disease Maternal Grandfather    Social History  Substance Use Topics  . Smoking status: Current Some Day Smoker    Types: Cigars  . Smokeless tobacco: None  . Alcohol Use: No   OB History    Gravida Para Term Preterm AB TAB SAB Ectopic Multiple Living   1    1  1    0     Review of Systems  HENT: Negative for facial swelling.   Eyes: Positive for  visual disturbance. Negative for photophobia and redness.  Neurological: Positive for headaches.  Psychiatric/Behavioral: Negative for suicidal ideas. The patient is nervous/anxious.       Allergies  Review of patient's allergies indicates no known allergies.  Home Medications   Prior to Admission medications   Medication Sig Start Date End Date Taking? Authorizing Provider  ibuprofen (ADVIL,MOTRIN) 800 MG tablet Take 1 tablet (800 mg total) by mouth 3 (three) times daily. 07/10/12   Heather Laisure, PA-C  ketorolac (TORADOL) 10 MG tablet Take 1 tablet (10 mg total) by mouth every 6 (six) hours as needed for pain. 07/13/12   Heather D Norman Herrlich, CNM   BP 139/86 mmHg  Pulse 84  Temp(Src) 98 F (36.7 C) (Oral)  Resp 16  SpO2 100%  LMP 05/05/2015 Physical Exam  Constitutional: She is oriented to person, place, and time. She appears well-developed and well-nourished. No distress.  HENT:  Head: Normocephalic and atraumatic.  Mouth/Throat: Oropharynx is clear and moist.  Eyes: EOM are normal. Pupils are equal, round, and reactive to light.  Neck: Neck supple.  Cardiovascular: Normal rate, regular rhythm and normal heart sounds.   No murmur heard. Pulmonary/Chest: Effort normal and breath sounds normal. No respiratory distress. She has no wheezes.  Abdominal: Soft. Bowel sounds are normal. She exhibits no distension. There is no tenderness.  Neurological: She is alert and oriented to person, place,  and time.  5 out of 5 strength in all 4 extremities, cranial nerves II through XII intact  Skin: Skin is warm and dry.  Psychiatric:  Bizarre affect, tangential speech  Nursing note and vitals reviewed.   ED Course  Procedures (including critical care time) Labs Review Labs Reviewed - No data to display  Imaging Review No results found. I have personally reviewed and evaluated these images and lab results as part of my medical decision-making.   EKG Interpretation None      MDM    Final diagnoses:  Adverse reaction to other psychotropic agent, initial encounter    Patient presents with headache and difficulty focusing as well as anxiety after Abilify injection. No signs or symptoms of anaphylaxis at this time. She reports that she feels much better and is declining any medication for 3 out of 10 headache.  Denies SI, HI, or active hallucinations. Discussed with patient close follow-up with [facility] for evaluation for alternative medications. Doubt life-threatening illness at this time.  After history, exam, and medical workup I feel the patient has been appropriately medically screened and is safe for discharge home. Pertinent diagnoses were discussed with the patient. Patient was given return precautions.     Merryl Hacker, MD 06/21/15 [phone]

## 2015-06-21 NOTE — Discharge Instructions (Signed)
You were seen today for an adverse reaction to Abilify. There is no evidence of an allergic reaction. You should follow-up with [facility] and discuss with them regarding alternative medications.

## 2015-06-21 NOTE — ED Notes (Addendum)
Pt reports "getting bumps around the crown of my head", pt states that she does not have acne.  Pt reports while in the waiting room having blurry distance when she was looking far away.  Pt reports "I felt like I was getting schizophrenia symptoms, I needed to talk to somebody, anybody, it was hurting to keep thoughts in." Pt denies history of schizophrenia.    Pt states that she has no idea why she was at [facility] for a week. She states that her sister told them that she was having abnormal thinking. Pt states that she doesn't think that she has ben having any abnormal thinking.

## 2016-05-03 ENCOUNTER — Emergency Department (HOSPITAL_COMMUNITY): Payer: No Typology Code available for payment source

## 2016-05-03 ENCOUNTER — Encounter (HOSPITAL_COMMUNITY): Payer: Self-pay | Admitting: Emergency Medicine

## 2016-05-03 ENCOUNTER — Emergency Department (HOSPITAL_COMMUNITY)
Admission: EM | Admit: 2016-05-03 | Discharge: 2016-05-04 | Disposition: A | Discharge status: Home / Self Care | Payer: No Typology Code available for payment source | Attending: Emergency Medicine | Admitting: Emergency Medicine

## 2016-05-03 DIAGNOSIS — Y939 Activity, unspecified: Secondary | ICD-10-CM | POA: Insufficient documentation

## 2016-05-03 DIAGNOSIS — Z79899 Other long term (current) drug therapy: Secondary | ICD-10-CM | POA: Insufficient documentation

## 2016-05-03 DIAGNOSIS — Y999 Unspecified external cause status: Secondary | ICD-10-CM | POA: Insufficient documentation

## 2016-05-03 DIAGNOSIS — S80211A Abrasion, right knee, initial encounter: Secondary | ICD-10-CM | POA: Insufficient documentation

## 2016-05-03 DIAGNOSIS — Y92410 Unspecified street and highway as the place of occurrence of the external cause: Secondary | ICD-10-CM | POA: Diagnosis not present

## 2016-05-03 DIAGNOSIS — S52514A Nondisplaced fracture of right radial styloid process, initial encounter for closed fracture: Secondary | ICD-10-CM | POA: Diagnosis not present

## 2016-05-03 DIAGNOSIS — F17210 Nicotine dependence, cigarettes, uncomplicated: Secondary | ICD-10-CM | POA: Insufficient documentation

## 2016-05-03 DIAGNOSIS — S6991XA Unspecified injury of right wrist, hand and finger(s), initial encounter: Secondary | ICD-10-CM | POA: Diagnosis present

## 2016-05-03 DIAGNOSIS — S7001XA Contusion of right hip, initial encounter: Secondary | ICD-10-CM | POA: Diagnosis not present

## 2016-05-03 DIAGNOSIS — S7002XA Contusion of left hip, initial encounter: Secondary | ICD-10-CM | POA: Insufficient documentation

## 2016-05-03 DIAGNOSIS — S52501A Unspecified fracture of the lower end of right radius, initial encounter for closed fracture: Secondary | ICD-10-CM

## 2016-05-03 DIAGNOSIS — Z23 Encounter for immunization: Secondary | ICD-10-CM | POA: Diagnosis not present

## 2016-05-03 MED ORDER — TETANUS-DIPHTH-ACELL PERTUSSIS 5-2.5-18.5 LF-MCG/0.5 IM SUSP
0.5000 mL | Freq: Once | INTRAMUSCULAR | Status: AC
  Administered 2016-05-03: 0.5 mL via INTRAMUSCULAR
  Filled 2016-05-03: qty 0.5
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
## 2016-05-03 NOTE — ED Notes (Signed)
Ortho tech called to apply splint. °
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
## 2016-08-11 ENCOUNTER — Encounter (HOSPITAL_COMMUNITY): Payer: Self-pay

## 2016-08-11 ENCOUNTER — Emergency Department (HOSPITAL_COMMUNITY)
Admission: EM | Admit: 2016-08-11 | Discharge: 2016-08-11 | Disposition: A | Discharge status: Home / Self Care | Payer: Self-pay | Attending: Emergency Medicine | Admitting: Emergency Medicine

## 2016-08-11 DIAGNOSIS — H6981 Other specified disorders of Eustachian tube, right ear: Secondary | ICD-10-CM

## 2016-08-11 DIAGNOSIS — F17290 Nicotine dependence, other tobacco product, uncomplicated: Secondary | ICD-10-CM | POA: Insufficient documentation

## 2016-08-11 DIAGNOSIS — H6991 Unspecified Eustachian tube disorder, right ear: Secondary | ICD-10-CM | POA: Insufficient documentation

## 2016-08-11 MED ORDER — IBUPROFEN 800 MG PO TABS: 800.0000 mg | ORAL_TABLET | Freq: Three times a day (TID) | ORAL | 0 refills | Status: DC | PRN

## 2016-08-11 MED ORDER — GUAIFENESIN ER 1200 MG PO TB12: 1.0000 | ORAL_TABLET | Freq: Two times a day (BID) | ORAL | 0 refills | Status: DC

## 2016-08-11 MED ORDER — BUDESONIDE 32 MCG/ACT NA SUSP: 1.0000 | Freq: Every day | NASAL | 0 refills | Status: DC

## 2016-08-11 NOTE — ED Provider Notes (Signed)
[facility] DEPT Provider Note   CSN: [id number] Arrival date & time: 08/11/16  1102  By signing my name below, I, Emmanuella Mensah, attest that this documentation has been prepared under the direction and in the presence of [employer], PA-C. Electronically Signed: Judithann Sauger, ED Scribe. 08/11/16. 12:16 PM.   History   Chief Complaint Chief Complaint  Patient presents with  . Head pressure    HPI Comments: Ashley Vega is a 25 y.o. female who presents to the Emergency Department complaining of gradual onset, intermittent moderate pain to her right head (concentrated behind the ear) she describes pressure onset one week ago. She reports associated intermittent lightheadedness and tinnitus. She denies any recent falls, injuries, or head trauma. She expresses concern that these symptoms are attributed to taking Abilify for one month, one year ago. No alleviating factors noted. Pt has not tried any medications PTA. She has NKDA. She denies any fever, chills, visual disturbances, sore throat, cough, nausea, vomiting, neck pain, or any other symptoms.   No PCP.   The history is provided by the patient. No language interpreter was used.    Past Medical History:  Diagnosis Date  . Fibroids   . No pertinent past medical history     Patient Active Problem List   Diagnosis Date Noted  . Fibroids 02/06/2013  . Heart murmur 02/06/2013  . Irregular menses 02/06/2013    Past Surgical History:  Procedure Laterality Date  . NO PAST SURGERIES      OB History    Gravida Para Term Preterm AB Living   1       1 0   SAB TAB Ectopic Multiple Live Births   1               Home Medications    Prior to Admission medications   Medication Sig Start Date End Date Taking? Authorizing Provider  ibuprofen (ADVIL,MOTRIN) 800 MG tablet Take 1 tablet (800 mg total) by mouth 3 (three) times daily. 07/10/12   Heather Laisure, PA-C  ketorolac (TORADOL) 10 MG tablet Take 1 tablet (10 mg  total) by mouth every 6 (six) hours as needed for pain. 07/13/12   Tresea Mall, CNM    Family History Family History  Problem Relation Age of Onset  . Cancer Maternal Grandmother   . Hypertension Maternal Grandfather   . Kidney disease Maternal Grandfather   . Other Neg Hx     Social History Social History  Substance Use Topics  . Smoking status: Current Some Day Smoker    Types: Cigars  . Smokeless tobacco: Never Used  . Alcohol use No     Allergies   Patient has no known allergies.   Review of Systems Review of Systems  Constitutional: Negative for chills and fever.  HENT: Positive for tinnitus. Negative for sore throat.   Eyes: Negative for visual disturbance.  Respiratory: Negative for cough.   Gastrointestinal: Negative for nausea and vomiting.  Musculoskeletal: Negative for neck pain.  Neurological: Positive for light-headedness and headaches.  All other systems reviewed and are negative.    Physical Exam Updated Vital Signs BP 134/92 (BP Location: Left Arm)   Pulse 92   Temp 97.9 F (36.6 C) (Oral)   Resp 18   SpO2 100%   Physical Exam  Constitutional: She is oriented to person, place, and time. She appears well-developed and well-nourished. No distress.  HENT:  Head: Normocephalic and atraumatic.  Eyes: EOM are normal. Pupils are equal, round,  and reactive to light.  Neck: Normal range of motion. Neck supple.  Cardiovascular: Normal rate, regular rhythm and normal heart sounds.   Pulmonary/Chest: Effort normal and breath sounds normal. No respiratory distress. She has no wheezes.  Musculoskeletal: Normal range of motion.  Neurological: She is alert and oriented to person, place, and time. No sensory deficit. She exhibits normal muscle tone. Coordination normal.  Skin: Skin is warm and dry. No erythema. No pallor.  Psychiatric: She has a normal mood and affect. Her behavior is normal.  Nursing note and vitals reviewed.    ED Treatments /  Results  DIAGNOSTIC STUDIES: Oxygen Saturation is 100% on RA, normal by my interpretation.    COORDINATION OF CARE: 12:16 PM- Pt advised of plan for treatment and pt agrees. Pt will receive Mucinex, Motrin, and Flonase.    Labs (all labs ordered are listed, but only abnormal results are displayed) Labs Reviewed - No data to display  EKG  EKG Interpretation None       Radiology No results found.  Procedures Procedures (including critical care time)  Medications Ordered in ED Medications - No data to display   Initial Impression / Assessment and Plan / ED Course  Irena Cords, PA-C has reviewed the triage vital signs and the nursing notes.  Pertinent labs & imaging results that were available during my care of the patient were reviewed by me and considered in my medical decision making (see chart for details).  Clinical Course     Pt HA treated and improved while in ED.  Presentation is  non concerning for [facility], ICH, Meningitis, or temporal arteritis. Pt is afebrile with no focal neuro deficits, nuchal rigidity, or change in vision. Pt is to follow up with PCP to discuss prophylactic medication. Pt verbalizes understanding and is agreeable with plan to dc. Feel that the patient has an upper respiratory illness, and eustachian tube dysfunction  Final Clinical Impressions(s) / ED Diagnoses   Final diagnoses:  None    New Prescriptions New Prescriptions   No medications on file   I personally performed the services described in this documentation, which was scribed in my presence. The recorded information has been reviewed and is accurate.   Dalia Heading, PA-C 08/15/16 0215    Duffy Bruce, MD 08/15/16 [phone]

## 2016-08-11 NOTE — ED Notes (Signed)
Declined W/C at D/C and was escorted to lobby by RN. 

## 2016-08-11 NOTE — ED Triage Notes (Signed)
Patient complains of 1 week of head pressure. States that the symptoms are intermittent. Denies trauma, denies pain. Talking on phone on arival

## 2016-08-11 NOTE — Discharge Instructions (Signed)
Return here as needed.  Increase her fluid intake.  Follow up with a primary care doctor or an urgent care

## 2017-10-02 ENCOUNTER — Emergency Department (HOSPITAL_COMMUNITY)
Admission: EM | Admit: 2017-10-02 | Discharge: 2017-10-02 | Disposition: A | Discharge status: Other Acute Inpatient Hospital | Payer: Self-pay | Attending: Emergency Medicine | Admitting: Emergency Medicine

## 2017-10-02 ENCOUNTER — Encounter (HOSPITAL_COMMUNITY): Payer: Self-pay | Admitting: Emergency Medicine

## 2017-10-02 ENCOUNTER — Other Ambulatory Visit: Payer: Self-pay

## 2017-10-02 DIAGNOSIS — F329 Major depressive disorder, single episode, unspecified: Secondary | ICD-10-CM | POA: Insufficient documentation

## 2017-10-02 DIAGNOSIS — R45851 Suicidal ideations: Secondary | ICD-10-CM | POA: Insufficient documentation

## 2017-10-02 DIAGNOSIS — F17290 Nicotine dependence, other tobacco product, uncomplicated: Secondary | ICD-10-CM | POA: Insufficient documentation

## 2017-10-02 DIAGNOSIS — F23 Brief psychotic disorder: Secondary | ICD-10-CM | POA: Insufficient documentation

## 2017-10-02 DIAGNOSIS — R42 Dizziness and giddiness: Secondary | ICD-10-CM | POA: Insufficient documentation

## 2017-10-02 DIAGNOSIS — Z046 Encounter for general psychiatric examination, requested by authority: Secondary | ICD-10-CM | POA: Insufficient documentation

## 2017-10-02 LAB — CBC
HCT: 41.4 % (ref 36.0–46.0)
HEMOGLOBIN: 12.8 g/dL (ref 12.0–15.0)
MCH: 21.5 pg — ABNORMAL LOW (ref 26.0–34.0)
MCHC: 30.9 g/dL (ref 30.0–36.0)
MCV: 69.7 fL — ABNORMAL LOW (ref 78.0–100.0)
PLATELETS: UNDETERMINED 10*3/uL (ref 150–400)
RBC: 5.94 MIL/uL — ABNORMAL HIGH (ref 3.87–5.11)
RDW: 17.7 % — ABNORMAL HIGH (ref 11.5–15.5)
WBC: 6.3 10*3/uL (ref 4.0–10.5)

## 2017-10-02 LAB — COMPREHENSIVE METABOLIC PANEL
ALT: 20 U/L (ref 14–54)
ANION GAP: 10 (ref 5–15)
AST: 31 U/L (ref 15–41)
Albumin: 4.6 g/dL (ref 3.5–5.0)
Alkaline Phosphatase: 83 U/L (ref 38–126)
BUN: 11 mg/dL (ref 6–20)
CALCIUM: 10 mg/dL (ref 8.9–10.3)
CHLORIDE: 105 mmol/L (ref 101–111)
CO2: 23 mmol/L (ref 22–32)
Creatinine, Ser: 0.94 mg/dL (ref 0.44–1.00)
GFR calc Af Amer: >60 mL/min (ref >60)
GFR calc non Af Amer: >60 mL/min (ref >60)
Glucose, Bld: 90 mg/dL (ref 65–99)
Potassium: 3.8 mmol/L (ref 3.5–5.1)
SODIUM: 138 mmol/L (ref 135–145)
Total Bilirubin: 0.2 mg/dL — ABNORMAL LOW (ref 0.3–1.2)
Total Protein: 9.2 g/dL — ABNORMAL HIGH (ref 6.5–8.1)

## 2017-10-02 LAB — RAPID URINE DRUG SCREEN, HOSP PERFORMED
Amphetamines: NOT DETECTED
Barbiturates: NOT DETECTED
Benzodiazepines: NOT DETECTED
COCAINE: NOT DETECTED
OPIATES: NOT DETECTED
TETRAHYDROCANNABINOL: NOT DETECTED

## 2017-10-02 LAB — PREGNANCY, URINE: Preg Test, Ur: NEGATIVE

## 2017-10-02 LAB — SALICYLATE LEVEL

## 2017-10-02 LAB — ACETAMINOPHEN LEVEL

## 2017-10-02 LAB — ETHANOL: Alcohol, Ethyl (B): <10 mg/dL (ref <10)

## 2017-10-02 MED ORDER — TRAZODONE HCL 50 MG PO TABS: 50.0000 mg | ORAL_TABLET | Freq: Every day | ORAL | Status: DC

## 2017-10-02 MED ORDER — SERTRALINE HCL 50 MG PO TABS
25.0000 mg | ORAL_TABLET | Freq: Every day | ORAL | Status: DC
  Filled 2017-10-02: qty 1

## 2017-10-02 MED ORDER — HYDROXYZINE HCL 25 MG PO TABS: 25.0000 mg | ORAL_TABLET | Freq: Three times a day (TID) | ORAL | Status: DC | PRN

## 2017-10-02 NOTE — Progress Notes (Addendum)
CSW received a call from [facility]  Pt has been accepted by: [facility] Number for report is: [phone] Pt's unit/room/bed number will be: [facility] [facility] Room 242 Bed 2 Accepting physician: Vella Kohler NP Supervised by Dr. Launa Grill  Pt can arrive ASAP on 1/30 before 11pm arrival or after 6 am on 1/31  Please call Gerald Stabs in admissions at ph:[phone]  if any issues arise before transport and please call the number for report to let [facility] know if pt can arrive before 11pm tonight.  CSW will update RN and EDP.  Alphonse Guild. Laterria Lasota, LCSW, LCAS, CSI Clinical Social Worker Ph: [phone]
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
## 2017-10-02 NOTE — ED Notes (Signed)
Bed: WA29 Expected date:  Expected time:  Means of arrival:  Comments: 

## 2017-10-02 NOTE — ED Notes (Signed)
Patient arrived to unit and is without any signs of distress at this time. Pt with bizarre affect and child-like questions. Pt is, however, cooperative with care. Sitter at bedside for safety.

## 2017-10-02 NOTE — ED Notes (Signed)
Patient transferred to [facility] report given

## 2017-10-02 NOTE — ED Notes (Signed)
Pt transported to [facility] by [facility]. All belongings returned to pt who signed for same. Pt was irritable about it, but calm and cooperative.
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
## 2017-10-02 NOTE — BH Assessment (Addendum)
Assessment Note  Ashley Vega is an 27 y.o. female who presents involuntarily to the [facility] accompanied by [employer] reporting symptoms of suicidal ideation and bizarre behavior.  Pt denies mental health history, however she reports going to [facility] in 2016 and staying approximately 9 days  .Pt states she doesn't know why she is here.   Pt states "The police came to my house and told her a close family member took out papers on me".  Pt denies taking medication currently.  Pt denies current suicidal ideation and denies having a plan. Pt denies any past attempts. Pt acknowledges symptoms including: sadness, fatigue, low self esteem, isolating, lack of motivation, hopelessness, sleeping less and eating less.  Pt reports having recent panic attacks when she as to use public transportation like the bus and when things are bigger than she is.  Pt denies homicidal ideation/ history of violence. Pt denies auditory or visual hallucinations or other psychotic symptoms. Pt states current stressors include not having any friends and having anxiety.   Pt lives with her mother and denies having supports. Pt denies history of abuse and trauma. Pt denies  family history of SI/MH/SA. Pt reports being unemployed. Pt has poor insight and impaired judgment. Pt's memory is unable to be assessed.  Pt's thought are very tangential.  Pt denies any legal history.  Pt denies OP histoty. IP history includes an admission to [facility] in 2016.  Pt states the Abilify that she was given in 2016 has caused her to have occasional numbness on her right side.  Pt denies alcohol/ substance abuse.  Pt is dressed in scrubs, alert, oriented x4 with tangential speech and normal motor behavior. Eye contact is good. Pt's mood is depressed and apathetic and affect is apathetic. Affect is congruent with mood. Thought process is tangential and flight of thought. There is no indication Pt is currently responding to internal stimuli or experiencing delusional  thought content. Pt was cooperative throughout assessment.   Diagnosis: F23 Brief psychotic disorder  Past Medical History:  Past Medical History:  Diagnosis Date  . Fibroids   . No pertinent past medical history     Past Surgical History:  Procedure Laterality Date  . NO PAST SURGERIES      Family History:  Family History  Problem Relation Age of Onset  . Cancer Maternal Grandmother   . Hypertension Maternal Grandfather   . Kidney disease Maternal Grandfather   . Other Neg Hx     Social History:  reports that she has been smoking cigars.  she has never used smokeless tobacco. She reports that she does not drink alcohol or use drugs.  Additional Social History:  Alcohol / Drug Use Pain Medications: See MAR Prescriptions: See MAR Over the Counter: See MAR History of alcohol / drug use?: No history of alcohol / drug abuse  CIWA: CIWA-Ar BP: (!) 149/88 Pulse Rate: 76 COWS:    Allergies: No Known Allergies  Home Medications:  (Not in a hospital admission)  OB/GYN Status:  No LMP recorded (lmp unknown). Patient is not currently having periods (Reason: Irregular Periods).  General Assessment Data [facility] of Assessment: [facility] TTS Assessment: In system Is this a Tele or Face-to-Face Assessment?: Face-to-Face Is this an Initial Assessment or a Re-assessment for this encounter?: Initial Assessment Marital status: Single Maiden name: N/A Is patient pregnant?: No Pregnancy Status: No Living Arrangements: Parent Can pt return to current living arrangement?: Yes Admission Status: Involuntary Referral Source: Other(Pt was brought in by [employer])  Insurance type: None     Crisis Care Plan Living Arrangements: Parent  Education Status Is patient currently in school?: No Highest grade of school patient has completed: 12th grade  Risk to self with the past 6 months Suicidal Ideation: No Has patient been a risk to self within the past 6 months prior to admission? :  No Suicidal Intent: No Has patient had any suicidal intent within the past 6 months prior to admission? : No Is patient at risk for suicide?: No Suicidal Plan?: No Has patient had any suicidal plan within the past 6 months prior to admission? : No Access to Means: No What has been your use of drugs/alcohol within the last 12 months?: Pt denies Previous Attempts/Gestures: No How many times?: 0 Other Self Harm Risks: Pt denies Triggers for Past Attempts: None known Intentional Self Injurious Behavior: None Family Suicide History: No Recent stressful life event(s): Other (Comment)(Pt states she has no friends and anxiety when taking the bus) Persecutory voices/beliefs?: No Depression: Yes Depression Symptoms: Despondent, Fatigue, Isolating, Insomnia, Loss of interest in usual pleasures Substance abuse history and/or treatment for substance abuse?: No Suicide prevention information given to non-admitted patients: Not applicable  Risk to Others within the past 6 months Homicidal Ideation: No Does patient have any lifetime risk of violence toward others beyond the six months prior to admission? : No Thoughts of Harm to Others: No Current Homicidal Intent: No Current Homicidal Plan: No Access to Homicidal Means: No Identified Victim: Pt denies History of harm to others?: No Assessment of Violence: None Noted Violent Behavior Description: Pt denies Does patient have access to weapons?: No Criminal Charges Pending?: No Does patient have a court date: No Is patient on probation?: No  Psychosis Hallucinations: None noted Delusions: None noted  Mental Status Report Appearance/Hygiene: In scrubs, Disheveled, Bizarre Eye Contact: Good Motor Activity: Unremarkable Speech: Tangential Level of Consciousness: Alert Mood: Apathetic Affect: Apathetic, Flat Anxiety Level: None Thought Processes: Tangential, Flight of Ideas Judgement: Impaired Orientation: Person, Place, Time,  Situation, Appropriate for developmental age Obsessive Compulsive Thoughts/Behaviors: None  Cognitive Functioning Concentration: Normal Memory: Recent Impaired, Remote Impaired IQ: Average Insight: Poor Impulse Control: Poor Appetite: Fair Weight Loss: 0 Weight Gain: 50 Sleep: Decreased Total Hours of Sleep: 4 Vegetative Symptoms: Not bathing, Staying in bed, Decreased grooming  ADLScreening [facility] Assessment Services) Patient's cognitive ability adequate to safely complete daily activities?: Yes Patient able to express need for assistance with ADLs?: Yes Independently performs ADLs?: Yes (appropriate for developmental age)  Prior Inpatient Therapy Prior Inpatient Therapy: Yes Prior Therapy Dates: 2016 Prior Therapy Facilty/Provider(s): [facility] Reason for Treatment: UTA  Prior Outpatient Therapy Prior Outpatient Therapy: No Does patient have an ACCT team?: No Does patient have Intensive In-House Services?  : No Does patient have [facility] services? : No Does patient have P4CC services?: No  ADL Screening (condition at time of admission) Patient's cognitive ability adequate to safely complete daily activities?: Yes Is the patient deaf or have difficulty hearing?: No Does the patient have difficulty seeing, even when wearing glasses/contacts?: No Does the patient have difficulty concentrating, remembering, or making decisions?: No Patient able to express need for assistance with ADLs?: Yes Does the patient have difficulty dressing or bathing?: No Independently performs ADLs?: Yes (appropriate for developmental age) Does the patient have difficulty walking or climbing stairs?: No Weakness of Legs: Right(Pt states she has numbness on her right side occassionally due to an abilify injection in 2016) Weakness of Arms/Hands: Right(Pt states she has  numbness on her right side occassionally due to an abilify injection in 2016)  [place]  Devices/Equipment: None    Abuse/Neglect Assessment (Assessment to be complete while patient is alone) Abuse/Neglect Assessment Can Be Completed: Yes Physical Abuse: Denies Verbal Abuse: Denies Sexual Abuse: Denies Exploitation of patient/patient's resources: Denies Self-Neglect: Denies     Regulatory affairs officer (For Healthcare) Does Patient Have a Medical Advance Directive?: No Would patient like information on creating a medical advance directive?: No - Patient declined    Additional Information 1:1 In Past 12 Months?: No CIRT Risk: No Elopement Risk: No Does patient have medical clearance?: Yes     Disposition: Gave clinical report to Patriciaann Clan, [place] who stated Pt meets criteria for impatient psychiatric treatment.  Wynonia Hazard, RN and [facility] at [facility] stated there are no available beds for the Pt.  SW to seek placement in the morning.  Notified Rosezella Rumpf, RN and [facility], DO of recommendation. Disposition Initial Assessment Completed for this Encounter: Yes Disposition of Patient: Inpatient treatment program Type of inpatient treatment program: Adult  On Site Evaluation by:   Reviewed with Physician:   Abran Cantor, MS, [facility] Therapeutic Triage Specialist   Abran Cantor 10/02/2017 5:37 AM

## 2017-10-02 NOTE — ED Notes (Addendum)
[employer] officer reports sister was concerned because pt was walking up to strangers stating she didn't want to be here anymore, asking them for a ride.   Pt states she was given a medication while at [facility] that now makes her dizzy and weak when on her feet for a while and that's why she was asking strangers for a ride home.

## 2017-10-02 NOTE — BH Assessment (Signed)
[facility] Assessment Progress Note  Per Buford Dresser, DO, this pt requires psychiatric hospitalization at this time.  Pt presents under IVC initiated by pt's sister, which EDP Pryor Curia, MD has upheld.  At 15:37 Gerald Stabs calls from [facility] to report that pt has been accepted to their facility by Dr Launa Grill to Rm [id number].  Ethelene Hal, FNP concurs with this decision.  Pt's nurse, Diane, has been notified, and agrees to call report to [phone].  Pt is to be transported via [facility].  [facility] stipulates that pt must arrive either before 23:00 tonight, or after 06:00 tomorrow.    [place] Coordinator [phone]

## 2017-10-02 NOTE — ED Provider Notes (Signed)
TIME SEEN: 2:25 AM  CHIEF COMPLAINT: IVC  HPI: Patient is a 27 year old female with previous psychiatric history requiring psychiatric hospitalization in 2017 at [facility] who presents to the emergency department under IVC with [facility].  Per the IVC paperwork, patient had been telling her sister "I do not want to be here anymore".  She also reportedly told a stranger the same thing in a store today.  Sister also reports that patient has been talking to herself and responding to internal stimuli.  Patient denies to me any SI, HI or hallucinations.  She denies any pain, fever, cough, vomiting or diarrhea.  ROS: See HPI Constitutional: no fever  Eyes: no drainage  ENT: no runny nose   Cardiovascular:  no chest pain  Resp: no SOB  GI: no vomiting GU: no dysuria Integumentary: no rash  Allergy: no hives  Musculoskeletal: no leg swelling  Neurological: no slurred speech ROS otherwise negative  PAST MEDICAL HISTORY/PAST SURGICAL HISTORY:  Past Medical History:  Diagnosis Date  . Fibroids   . No pertinent past medical history     MEDICATIONS:  Prior to Admission medications   Medication Sig Start Date End Date Taking? Authorizing Provider  budesonide (RHINOCORT AQUA) 32 MCG/ACT nasal spray Place 1 spray into both nostrils daily. Patient not taking: Reported on 10/02/2017 08/11/16   Dalia Heading, PA-C  Guaifenesin 1200 MG TB12 Take 1 tablet (1,200 mg total) by mouth 2 (two) times daily. Patient not taking: Reported on 10/02/2017 08/11/16   Dalia Heading, PA-C  ibuprofen (ADVIL,MOTRIN) 800 MG tablet Take 1 tablet (800 mg total) by mouth every 8 (eight) hours as needed. Patient not taking: Reported on 10/02/2017 08/11/16   Dalia Heading, PA-C  ketorolac (TORADOL) 10 MG tablet Take 1 tablet (10 mg total) by mouth every 6 (six) hours as needed for pain. Patient not taking: Reported on 10/02/2017 07/13/12   Tresea Mall, CNM    ALLERGIES:  No Known  Allergies  SOCIAL HISTORY:  Social History   Tobacco Use  . Smoking status: Current Some Day Smoker    Types: Cigars  . Smokeless tobacco: Never Used  Substance Use Topics  . Alcohol use: No    FAMILY HISTORY: Family History  Problem Relation Age of Onset  . Cancer Maternal Grandmother   . Hypertension Maternal Grandfather   . Kidney disease Maternal Grandfather   . Other Neg Hx     EXAM: BP (!) 144/102 (BP Location: Right Arm)   Pulse 83   Temp 98.3 F (36.8 C) (Oral)   Resp 16   Ht 5\' 5"  (1.651 m)   Wt 81.6 kg (180 lb)   LMP  (LMP Unknown) Comment: "last month"  SpO2 100%   BMI 29.95 kg/m  CONSTITUTIONAL: Alert and oriented and responds appropriately to questions. Well-appearing; well-nourished HEAD: Normocephalic EYES: Conjunctivae clear, pupils appear equal, EOMI ENT: normal nose; moist mucous membranes NECK: Supple, no meningismus, no nuchal rigidity, no LAD  CARD: RRR; S1 and S2 appreciated; no murmurs, no clicks, no rubs, no gallops RESP: Normal chest excursion without splinting or tachypnea; breath sounds clear and equal bilaterally; no wheezes, no rhonchi, no rales, no hypoxia or respiratory distress, speaking full sentences ABD/GI: Normal bowel sounds; non-distended; soft, non-tender, no rebound, no guarding, no peritoneal signs, no hepatosplenomegaly BACK:  The back appears normal and is non-tender to palpation, there is no CVA tenderness EXT: Normal ROM in all joints; non-tender to palpation; no edema; normal capillary refill; no cyanosis, no  calf tenderness or swelling    SKIN: Normal color for age and race; warm; no rash NEURO: Moves all extremities equally PSYCH: Patient has a very flat and bizarre affect.  She denies SI, HI or hallucinations currently.  Does appear to be responding to internal stimuli.  MEDICAL DECISION MAKING: Patient here under IVC.  I have completed my portion of the IVC paperwork as well.  Will obtain screening labs, urine and  discuss with TTS.  ED PROGRESS: Labs and urine showed no acute abnormality.  Pregnancy test negative.  No signs of intoxication, overdose on Tylenol or salicylate.  Patient is medically cleared.  Will consult TTS.    5:20 AM  D/w TTS.  They have recommended inpatient psychiatric treatment and I agree.  I have completed patient's IVC paperwork.  TTS will work on psychiatric placement.   I reviewed all nursing notes, vitals, pertinent previous records, EKGs, lab and urine results, imaging (as available).     Sharon Stapel, Delice Bison, DO 10/02/17 [phone]

## 2017-10-26 ENCOUNTER — Emergency Department (HOSPITAL_COMMUNITY)
Admission: EM | Admit: 2017-10-26 | Discharge: 2017-10-26 | Disposition: A | Discharge status: Home / Self Care | Payer: Self-pay | Attending: Emergency Medicine | Admitting: Emergency Medicine

## 2017-10-26 ENCOUNTER — Other Ambulatory Visit: Payer: Self-pay

## 2017-10-26 ENCOUNTER — Encounter (HOSPITAL_COMMUNITY): Payer: Self-pay | Admitting: Emergency Medicine

## 2017-10-26 DIAGNOSIS — Z5321 Procedure and treatment not carried out due to patient leaving prior to being seen by health care provider: Secondary | ICD-10-CM | POA: Insufficient documentation

## 2017-10-26 DIAGNOSIS — R509 Fever, unspecified: Secondary | ICD-10-CM | POA: Insufficient documentation

## 2017-10-26 NOTE — ED Notes (Signed)
No answer x2 

## 2017-10-26 NOTE — ED Notes (Signed)
Pt came back to triage and asked if we had called her.  States she wants a bed to lay down.  Informed pt of wait for treatment room.

## 2017-10-26 NOTE — ED Notes (Signed)
Pt checked in and asked registration if we had snacks and drinks for patients being seen.

## 2017-10-26 NOTE — ED Triage Notes (Addendum)
Reports fever since yesterday.  Denies any other complaints.  Pt requesting food on arrival.

## 2020-03-02 ENCOUNTER — Ambulatory Visit (HOSPITAL_COMMUNITY)
Admission: EM | Admit: 2020-03-02 | Discharge: 2020-03-02 | Disposition: A | Discharge status: Home / Self Care | Payer: No Payment, Other | Attending: Psychiatry | Admitting: Psychiatry

## 2020-03-02 ENCOUNTER — Other Ambulatory Visit: Payer: Self-pay

## 2020-03-02 DIAGNOSIS — F419 Anxiety disorder, unspecified: Secondary | ICD-10-CM | POA: Diagnosis not present

## 2020-03-02 DIAGNOSIS — F22 Delusional disorders: Secondary | ICD-10-CM | POA: Diagnosis not present
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
## 2020-03-02 NOTE — Discharge Instructions (Addendum)
You may call [facility], should you decide to find new therapist.    Follow up with Rollene Fare with [facility] for your scheduled appointment.

## 2020-09-14 ENCOUNTER — Emergency Department (HOSPITAL_COMMUNITY)
Admission: EM | Admit: 2020-09-14 | Discharge: 2020-09-15 | Disposition: A | Discharge status: Home / Self Care | Payer: Self-pay | Attending: Emergency Medicine | Admitting: Emergency Medicine

## 2020-09-14 ENCOUNTER — Encounter (HOSPITAL_COMMUNITY): Payer: Self-pay

## 2020-09-14 ENCOUNTER — Other Ambulatory Visit: Payer: Self-pay

## 2020-09-14 DIAGNOSIS — F17290 Nicotine dependence, other tobacco product, uncomplicated: Secondary | ICD-10-CM | POA: Insufficient documentation

## 2020-09-14 DIAGNOSIS — R519 Headache, unspecified: Secondary | ICD-10-CM | POA: Insufficient documentation

## 2020-09-14 DIAGNOSIS — R079 Chest pain, unspecified: Secondary | ICD-10-CM | POA: Insufficient documentation

## 2020-09-14 NOTE — ED Triage Notes (Signed)
Pt states that she has been having a headache for the past two weeks since her mom passed away. Denies SI/HI/AVH, denies cough, fever, unvaccinated. Pt reports photosensitivity.

## 2020-09-15 ENCOUNTER — Other Ambulatory Visit: Payer: Self-pay

## 2020-09-15 ENCOUNTER — Emergency Department (HOSPITAL_COMMUNITY)
Admission: EM | Admit: 2020-09-15 | Discharge: 2020-09-16 | Disposition: A | Discharge status: Home / Self Care | Payer: Self-pay | Attending: Emergency Medicine | Admitting: Emergency Medicine

## 2020-09-15 ENCOUNTER — Emergency Department (HOSPITAL_COMMUNITY): Payer: Self-pay

## 2020-09-15 DIAGNOSIS — F17290 Nicotine dependence, other tobacco product, uncomplicated: Secondary | ICD-10-CM | POA: Insufficient documentation

## 2020-09-15 DIAGNOSIS — R519 Headache, unspecified: Secondary | ICD-10-CM | POA: Insufficient documentation

## 2020-09-15 LAB — I-STAT BETA HCG BLOOD, ED (MC, WL, AP ONLY): I-stat hCG, quantitative: <5 m[IU]/mL (ref <5)

## 2020-09-15 MED ORDER — NAPROXEN 500 MG PO TABS: 500.0000 mg | ORAL_TABLET | Freq: Two times a day (BID) | ORAL | 0 refills | Status: DC | PRN

## 2020-09-15 MED ORDER — DIPHENHYDRAMINE HCL 50 MG/ML IJ SOLN
12.5000 mg | Freq: Once | INTRAMUSCULAR | Status: AC
  Administered 2020-09-15: 12.5 mg via INTRAVENOUS
  Filled 2020-09-15: qty 1

## 2020-09-15 MED ORDER — ACETAMINOPHEN 325 MG PO TABS
650.0000 mg | ORAL_TABLET | Freq: Once | ORAL | Status: AC
  Administered 2020-09-15: 650 mg via ORAL
  Filled 2020-09-15: qty 2

## 2020-09-15 MED ORDER — METOCLOPRAMIDE HCL 5 MG/ML IJ SOLN
10.0000 mg | Freq: Once | INTRAMUSCULAR | Status: AC
  Administered 2020-09-15: 10 mg via INTRAVENOUS
  Filled 2020-09-15: qty 2

## 2020-09-15 NOTE — ED Notes (Signed)
Pt verbalizes understanding of d/c instructions. Prescriptions reviewed with patient. Pt ambulatory at d/c with all belongings.  

## 2020-09-15 NOTE — ED Notes (Signed)
Called pt for triage no answer x2 

## 2020-09-15 NOTE — ED Notes (Signed)
Pt back from Ct

## 2020-09-15 NOTE — ED Notes (Addendum)
Pt transported to CT ?

## 2020-09-15 NOTE — ED Provider Notes (Signed)
[facility] EMERGENCY DEPARTMENT Provider Note   CSN: [id number] Arrival date & time: 09/14/20  2009     History Chief Complaint  Patient presents with  . Headache    Ashley Vega is a 30 y.o. female with a hx of fibroids who presents to the ED with complaints of headaches x 2-3 weeks. Patient reports she is having intermittent headaches, right sided, gradual onset, steady progression, lasts for hours at a time. No specific triggers. Reports worse with bright lights, no alleviating factors, no intervention PTA. Has never had headaches like this. Denies fever, neck stiffness, visual disturbance, numbness, weakness, syncope, or recent head injury. She also mentions chest discomfort intermittently for the past 2-3 weeks, anterior, non-radiating, lasts a few minutes at a time, no alleviating/aggravating factors. Denies nausea, vomiting, diaphoresis, dyspnea, leg pain/swelling, hemoptysis, recent surgery/trauma, recent long travel, hormone use, personal hx of cancer, or hx of DVT/PE. Her mother recently passed away a few weeks ago, she does not think this is contributing to her sxs, she denies SI, HI, or hallucinations. Denies recent injuries.     HPI     Past Medical History:  Diagnosis Date  . Fibroids   . No pertinent past medical history     Patient Active Problem List   Diagnosis Date Noted  . Fibroids 02/06/2013  . Heart murmur 02/06/2013  . Irregular menses 02/06/2013    Past Surgical History:  Procedure Laterality Date  . NO PAST SURGERIES       OB History    Gravida  1   Para      Term      Preterm      AB  1   Living  0     SAB  1   IAB      Ectopic      Multiple      Live Births              Family History  Problem Relation Age of Onset  . Cancer Maternal Grandmother   . Hypertension Maternal Grandfather   . Kidney disease Maternal Grandfather   . Other Neg Hx     Social History   Tobacco Use  . Smoking status:  Current Some Day Smoker    Types: Cigars  . Smokeless tobacco: Never Used  Substance Use Topics  . Alcohol use: No  . Drug use: No    Home Medications Prior to Admission medications   Medication Sig Start Date End Date Taking? Authorizing Provider  budesonide (RHINOCORT AQUA) 32 MCG/ACT nasal spray Place 1 spray into both nostrils daily. Patient not taking: Reported on 10/02/2017 08/11/16   Dalia Heading, PA-C  Guaifenesin 1200 MG TB12 Take 1 tablet (1,200 mg total) by mouth 2 (two) times daily. Patient not taking: Reported on 10/02/2017 08/11/16   Dalia Heading, PA-C  ibuprofen (ADVIL,MOTRIN) 800 MG tablet Take 1 tablet (800 mg total) by mouth every 8 (eight) hours as needed. Patient not taking: Reported on 10/02/2017 08/11/16   Dalia Heading, PA-C  ketorolac (TORADOL) 10 MG tablet Take 1 tablet (10 mg total) by mouth every 6 (six) hours as needed for pain. Patient not taking: Reported on 10/02/2017 07/13/12   Marcille Buffy D, CNM    Allergies    Patient has no known allergies.  Review of Systems   Review of Systems  Constitutional: Negative for chills, diaphoresis and fever.  Eyes: Negative for visual disturbance.  Respiratory: Negative for cough and shortness of  breath.   Cardiovascular: Positive for chest pain. Negative for leg swelling.  Gastrointestinal: Negative for abdominal pain, nausea and vomiting.  Musculoskeletal: Negative for neck stiffness.  Neurological: Positive for headaches. Negative for dizziness, syncope, speech difficulty, weakness and numbness.  All other systems reviewed and are negative.   Physical Exam Updated Vital Signs BP (!) 163/104 (BP Location: Right Arm)   Pulse 89   Temp 99.2 F (37.3 C) (Oral)   Resp 16   SpO2 100%   Physical Exam Vitals and nursing note reviewed.  Constitutional:      General: She is not in acute distress.    Appearance: She is well-developed. She is not toxic-appearing.  HENT:     Head: Normocephalic  and atraumatic.  Eyes:     General: Vision grossly intact. Gaze aligned appropriately.        Right eye: No discharge.        Left eye: No discharge.     Extraocular Movements: Extraocular movements intact.     Conjunctiva/sclera: Conjunctivae normal.     Comments: PERRL. No proptosis.   Cardiovascular:     Rate and Rhythm: Normal rate and regular rhythm.  Pulmonary:     Effort: Pulmonary effort is normal. No respiratory distress.     Breath sounds: Normal breath sounds. No wheezing, rhonchi or rales.  Chest:     Chest wall: Tenderness (anterior chest wall which reproduces patient's chest pain) present.  Abdominal:     General: There is no distension.     Palpations: Abdomen is soft.     Tenderness: There is no abdominal tenderness.  Musculoskeletal:     Cervical back: Normal range of motion and neck supple. No rigidity.     Comments: No LE edema or calf tenderness.   Skin:    General: Skin is warm and dry.     Findings: No rash.  Neurological:     Comments: Alert. Clear speech. No facial droop. CNIII-XII grossly intact. Bilateral upper and lower extremities' sensation grossly intact. 5/5 symmetric strength with grip strength and with plantar and dorsi flexion bilaterally . Normal finger to nose bilaterally. Negative pronator drift. Negative Romberg sign. Gait is steady and intact.   Psychiatric:        Behavior: Behavior normal.     ED Results / Procedures / Treatments   Labs (all labs ordered are listed, but only abnormal results are displayed) Labs Reviewed  I-STAT BETA HCG BLOOD, ED ([facility], [facility], [facility] ONLY)    EKG EKG Interpretation  Date/Time:  Thursday September 15 2020 04:47:38 EST Ventricular Rate:  69 PR Interval:  160 QRS Duration: 90 QT Interval:  432 QTC Calculation: 462 R Axis:   45 Text Interpretation: Normal sinus rhythm Normal ECG When compared with ECG of 09/07/2004, No significant change was found Confirmed by Delora Fuel ([id number]) on 09/15/2020 5:08:44  AM   Radiology DG Chest 2 View  Result Date: 09/15/2020 CLINICAL DATA:  Chest pain EXAM: CHEST - 2 VIEW COMPARISON:  None. FINDINGS: The heart size and mediastinal contours are within normal limits. Both lungs are clear. The visualized skeletal structures are unremarkable. IMPRESSION: No active cardiopulmonary disease. Electronically Signed   By: Ulyses Jarred M.D.   On: 09/15/2020 02:20   CT Head Wo Contrast  Result Date: 09/15/2020 CLINICAL DATA:  30 year old female with persistent headache for 2 weeks following the death of her mother. EXAM: CT HEAD WITHOUT CONTRAST TECHNIQUE: Contiguous axial images were obtained from the base of the  skull through the vertex without intravenous contrast. COMPARISON:  None. FINDINGS: Brain: Cerebral volume is within normal limits. No midline shift, ventriculomegaly, mass effect, evidence of mass lesion, intracranial hemorrhage or evidence of cortically based acute infarction. Gray-white matter differentiation is within normal limits throughout the brain. Vascular: No suspicious intracranial vascular hyperdensity. Skull: Negative. Sinuses/Orbits: Visualized paranasal sinuses and mastoids are clear. Other: Visualized orbits and scalp soft tissues are within normal limits. IMPRESSION: Normal noncontrast Head CT. Electronically Signed   By: Genevie Ann M.D.   On: 09/15/2020 04:22    Procedures Procedures (including critical care time)  Medications Ordered in ED Medications  metoCLOPramide (REGLAN) injection 10 mg (has no administration in time range)  diphenhydrAMINE (BENADRYL) injection 12.5 mg (has no administration in time range)  acetaminophen (TYLENOL) tablet 650 mg (has no administration in time range)    ED Course  I have reviewed the triage vital signs and the nursing notes.  Pertinent labs & imaging results that were available during my care of the patient were reviewed by me and considered in my medical decision making (see chart for details).     MDM Rules/Calculators/A&P                         Patient presents to the ED with complaints of intermittent headaches & chest pain x 2-3 weeks. Nontoxic, vitals w/ somewhat elevated BP- otherwise unremarkable. Low suspicion for HTN emergency.   Additional history obtained:  Additional history obtained from chart review & nursing note review. EKG: NSR, no ischemia noted/significant changes noted.  Lab Tests:  I Ordered, reviewed, and interpreted labs, which included:  Pregnancy test: Negative Imaging Studies ordered:  I ordered imaging studies which included CXR & CT head wo contrast, I independently visualized and interpreted imaging which showed:  CXR: No active cardiopulmonary disease.  CT head: normal  Headache- given no hx of same- ct head obtained & w/o acute process. Headaches have gradual onset with steady progression in severity, no neuro deficits, visual disturbance, dizziness, fever, or nuchal rigidity- non concerning for [facility], ICH, ischemic CVA, dural venous sinus thrombosis, acute glaucoma, giant cell arteritis, mass, or meningitis. Patient treated for headache with migraine cocktail & tylenol with improvement.   Chest pain- CXR w/o pneumothorax, pneumonia, or pulmonary edema. EKG without ischemic changes to suggest ACS. Low risk wells- doubt PE. Reproducible on exam, in otherwise young fairly health patient, raising concern for MSK.   Also question degree of stress/anxiety/depression component to sxs with recent death of her mother just prior to onset. No SI/HI/acute psychosis. Will provide symptomatic care for her areas of discomfort with resources & recommended PCP follow up. I discussed results, treatment plan, need for follow-up, and return precautions with the patient. Provided opportunity for questions, patient confirmed understanding and is in agreement with plan.   Portions of this note were generated with [employer]. Dictation errors may occur despite best  attempts at proofreading.  Final Clinical Impression(s) / ED Diagnoses Final diagnoses:  Nonintractable headache, unspecified chronicity pattern, unspecified headache type    Rx / DC Orders ED Discharge Orders         Ordered    naproxen (NAPROSYN) 500 MG tablet  2 times daily PRN        09/15/20 0505           Kristopher Delk, Glynda Jaeger, PA-C 91/79/15 0569    Delora Fuel, MD 79/48/01 [phone]

## 2020-09-15 NOTE — Discharge Instructions (Signed)
You were seen in the emergency department today for headache and chest pain.  Your head CT and chest x-ray were normal.  Your EKG did not show any significant abnormalities.  Your pregnancy test was negative.  You were given a migraine cocktail and Tylenol with improvement of your symptoms.  We are sending home with naproxen to help with headaches as well as chest pain  - Naproxen is a nonsteroidal anti-inflammatory medication that will help with pain and swelling. Be sure to take this medication as prescribed with food, 1 pill every 12 hours,  It should be taken with food, as it can cause stomach upset, and more seriously, stomach bleeding. Do not take other nonsteroidal anti-inflammatory medications with this such as Advil, Motrin, Aleve, Mobic, Goodie Powder, or Motrin.    You make take Tylenol per over the counter dosing with these medications.   We have prescribed you new medication(s) today. Discuss the medications prescribed today with your pharmacist as they can have adverse effects and interactions with your other medicines including over the counter and prescribed medications. Seek medical evaluation if you start to experience new or abnormal symptoms after taking one of these medicines, seek care immediately if you start to experience difficulty breathing, feeling of your throat closing, facial swelling, or rash as these could be indications of a more serious allergic reaction   Please call to primary care provider within 3 days for reevaluation.  We have also provided additional resources if you're having any increased stress, anxiety, or depression.  Return to the ER at anytime for any new or worsening symptoms or any other concerns.

## 2020-09-15 NOTE — ED Triage Notes (Signed)
Pt here because she "would like some tylenol for a little headache." Seen last night for same

## 2020-09-16 ENCOUNTER — Other Ambulatory Visit: Payer: Self-pay

## 2020-09-16 NOTE — Discharge Instructions (Addendum)
Take Motrin and Tylenol for your headache.  If symptoms worsen return to the ER.

## 2020-09-16 NOTE — ED Provider Notes (Signed)
[facility] EMERGENCY DEPARTMENT Provider Note   CSN: [id number] Arrival date & time: 09/15/20  2237     History Chief Complaint  Patient presents with  . Headache    Ashley Vega is a 30 y.o. female.  HPI 30 year old presents the ER for evaluation of headache.  Patient was seen earlier today and discharged with same symptoms had a negative CT scan at that time.  Patient reports that she just wants "Tylenol".  On my or examination patient denies any pain states this has resolved at this time.  Patient has no other associated symptoms.    Past Medical History:  Diagnosis Date  . Fibroids   . No pertinent past medical history     Patient Active Problem List   Diagnosis Date Noted  . Fibroids 02/06/2013  . Heart murmur 02/06/2013  . Irregular menses 02/06/2013    Past Surgical History:  Procedure Laterality Date  . NO PAST SURGERIES       OB History    Gravida  1   Para      Term      Preterm      AB  1   Living  0     SAB  1   IAB      Ectopic      Multiple      Live Births              Family History  Problem Relation Age of Onset  . Cancer Maternal Grandmother   . Hypertension Maternal Grandfather   . Kidney disease Maternal Grandfather   . Other Neg Hx     Social History   Tobacco Use  . Smoking status: Current Some Day Smoker    Types: Cigars  . Smokeless tobacco: Never Used  Substance Use Topics  . Alcohol use: No  . Drug use: No    Home Medications Prior to Admission medications   Medication Sig Start Date End Date Taking? Authorizing Provider  naproxen (NAPROSYN) 500 MG tablet Take 1 tablet (500 mg total) by mouth 2 (two) times daily as needed for moderate pain. 09/15/20   Petrucelli, Samantha R, PA-C  budesonide (RHINOCORT AQUA) 32 MCG/ACT nasal spray Place 1 spray into both nostrils daily. Patient not taking: Reported on 10/02/2017 08/11/16 09/15/20  Dalia Heading, PA-C    Allergies    Patient has  no known allergies.  Review of Systems   Review of Systems  Constitutional: Negative for chills and fever.  HENT: Negative for congestion.   Eyes: Negative for discharge.  Respiratory: Negative for cough.   Gastrointestinal: Negative for vomiting.  Psychiatric/Behavioral: Negative for confusion.    Physical Exam Updated Vital Signs BP 135/80 (BP Location: Right Arm)   Pulse 81   Temp 98.4 F (36.9 C) (Oral)   Resp 18   Ht 5\' 4"  (1.626 m)   Wt 81.6 kg   SpO2 100%   BMI 30.90 kg/m   Physical Exam Vitals and nursing note reviewed.  Constitutional:      General: She is not in acute distress.    Appearance: She is well-developed and well-nourished.  HENT:     Head: Normocephalic and atraumatic.  Eyes:     General: No scleral icterus.       Right eye: No discharge.        Left eye: No discharge.  Pulmonary:     Effort: No respiratory distress.  Musculoskeletal:  General: Normal range of motion.     Cervical back: Normal range of motion.  Skin:    Coloration: Skin is not pale.  Neurological:     Mental Status: She is alert.  Psychiatric:        Behavior: Behavior normal.        Thought Content: Thought content normal.        Judgment: Judgment normal.     ED Results / Procedures / Treatments   Labs (all labs ordered are listed, but only abnormal results are displayed) Labs Reviewed - No data to display  EKG None  Radiology DG Chest 2 View  Result Date: 09/15/2020 CLINICAL DATA:  Chest pain EXAM: CHEST - 2 VIEW COMPARISON:  None. FINDINGS: The heart size and mediastinal contours are within normal limits. Both lungs are clear. The visualized skeletal structures are unremarkable. IMPRESSION: No active cardiopulmonary disease. Electronically Signed   By: Ulyses Jarred M.D.   On: 09/15/2020 02:20   CT Head Wo Contrast  Result Date: 09/15/2020 CLINICAL DATA:  30 year old female with persistent headache for 2 weeks following the death of her mother. EXAM: CT  HEAD WITHOUT CONTRAST TECHNIQUE: Contiguous axial images were obtained from the base of the skull through the vertex without intravenous contrast. COMPARISON:  None. FINDINGS: Brain: Cerebral volume is within normal limits. No midline shift, ventriculomegaly, mass effect, evidence of mass lesion, intracranial hemorrhage or evidence of cortically based acute infarction. Gray-white matter differentiation is within normal limits throughout the brain. Vascular: No suspicious intracranial vascular hyperdensity. Skull: Negative. Sinuses/Orbits: Visualized paranasal sinuses and mastoids are clear. Other: Visualized orbits and scalp soft tissues are within normal limits. IMPRESSION: Normal noncontrast Head CT. Electronically Signed   By: Genevie Ann M.D.   On: 09/15/2020 04:22    Procedures Procedures (including critical care time)  Medications Ordered in ED Medications  acetaminophen (TYLENOL) tablet 650 mg (650 mg Oral Given 09/15/20 2319)    ED Course  I have reviewed the triage vital signs and the nursing notes.  Pertinent labs & imaging results that were available during my care of the patient were reviewed by me and considered in my medical decision making (see chart for details).    MDM Rules/Calculators/A&P                          Patient requesting Tylenol for headache.  Seen earlier today with a work-up that included a CT scan that was normal at that time.  Vital signs reassuring.  On my examination she denies any headache.  Will discharge home with outpatient follow-up.  Doubt any emergent cause of patient's headache to include subarachnoid hemorrhage, CVA, ICH, meningitis, intracranial mass, glaucoma. Final Clinical Impression(s) / ED Diagnoses Final diagnoses:  Nonintractable headache, unspecified chronicity pattern, unspecified headache type    Rx / DC Orders ED Discharge Orders    None       Aaron Edelman 09/16/20 2130    Fatima Blank, MD 09/16/20  1929

## 2021-02-19 DIAGNOSIS — F209 Schizophrenia, unspecified: Secondary | ICD-10-CM | POA: Insufficient documentation

## 2021-02-19 DIAGNOSIS — Y906 Blood alcohol level of 120-199 mg/100 ml: Secondary | ICD-10-CM | POA: Insufficient documentation

## 2021-02-19 DIAGNOSIS — F17290 Nicotine dependence, other tobacco product, uncomplicated: Secondary | ICD-10-CM | POA: Insufficient documentation

## 2021-02-19 DIAGNOSIS — Z91010 Allergy to peanuts: Secondary | ICD-10-CM | POA: Insufficient documentation

## 2021-02-19 DIAGNOSIS — Z20822 Contact with and (suspected) exposure to covid-19: Secondary | ICD-10-CM | POA: Insufficient documentation

## 2021-02-19 DIAGNOSIS — R440 Auditory hallucinations: Secondary | ICD-10-CM | POA: Insufficient documentation

## 2021-02-20 ENCOUNTER — Other Ambulatory Visit: Payer: Self-pay

## 2021-02-20 ENCOUNTER — Emergency Department (HOSPITAL_COMMUNITY)
Admission: EM | Admit: 2021-02-20 | Discharge: 2021-02-20 | Disposition: A | Discharge status: Other Acute Inpatient Hospital | Payer: Self-pay | Attending: Emergency Medicine | Admitting: Emergency Medicine

## 2021-02-20 ENCOUNTER — Inpatient Hospital Stay (HOSPITAL_COMMUNITY)
Admission: AD | Admit: 2021-02-20 | Discharge: 2021-03-03 | DRG: 885 | Disposition: A | Discharge status: Home / Self Care | Payer: No Typology Code available for payment source | Attending: Psychiatry | Admitting: Psychiatry

## 2021-02-20 ENCOUNTER — Encounter (HOSPITAL_COMMUNITY): Payer: Self-pay

## 2021-02-20 ENCOUNTER — Encounter (HOSPITAL_COMMUNITY): Payer: Self-pay | Admitting: Nurse Practitioner

## 2021-02-20 DIAGNOSIS — F200 Paranoid schizophrenia: Principal | ICD-10-CM | POA: Diagnosis present

## 2021-02-20 DIAGNOSIS — D509 Iron deficiency anemia, unspecified: Secondary | ICD-10-CM | POA: Diagnosis present

## 2021-02-20 DIAGNOSIS — F209 Schizophrenia, unspecified: Secondary | ICD-10-CM | POA: Diagnosis present

## 2021-02-20 DIAGNOSIS — Z87892 Personal history of anaphylaxis: Secondary | ICD-10-CM

## 2021-02-20 DIAGNOSIS — R440 Auditory hallucinations: Secondary | ICD-10-CM

## 2021-02-20 DIAGNOSIS — K5900 Constipation, unspecified: Secondary | ICD-10-CM | POA: Diagnosis present

## 2021-02-20 DIAGNOSIS — Z91010 Allergy to peanuts: Secondary | ICD-10-CM

## 2021-02-20 DIAGNOSIS — Z8249 Family history of ischemic heart disease and other diseases of the circulatory system: Secondary | ICD-10-CM

## 2021-02-20 DIAGNOSIS — G4700 Insomnia, unspecified: Secondary | ICD-10-CM | POA: Diagnosis present

## 2021-02-20 DIAGNOSIS — Z79899 Other long term (current) drug therapy: Secondary | ICD-10-CM

## 2021-02-20 DIAGNOSIS — I10 Essential (primary) hypertension: Secondary | ICD-10-CM | POA: Diagnosis present

## 2021-02-20 LAB — CBC WITH DIFFERENTIAL/PLATELET
Abs Immature Granulocytes: 0.03 10*3/uL (ref 0.00–0.07)
Basophils Absolute: 0 10*3/uL (ref 0.0–0.1)
Basophils Relative: 0 %
Eosinophils Absolute: 0.1 10*3/uL (ref 0.0–0.5)
Eosinophils Relative: 1 %
HCT: 35.8 % — ABNORMAL LOW (ref 36.0–46.0)
Hemoglobin: 10 g/dL — ABNORMAL LOW (ref 12.0–15.0)
Immature Granulocytes: 0 %
Lymphocytes Relative: 35 %
Lymphs Abs: 2.9 10*3/uL (ref 0.7–4.0)
MCH: 20.3 pg — ABNORMAL LOW (ref 26.0–34.0)
MCHC: 27.9 g/dL — ABNORMAL LOW (ref 30.0–36.0)
MCV: 72.6 fL — ABNORMAL LOW (ref 80.0–100.0)
Monocytes Absolute: 0.8 10*3/uL (ref 0.1–1.0)
Monocytes Relative: 9 %
Neutro Abs: 4.6 10*3/uL (ref 1.7–7.7)
Neutrophils Relative %: 55 %
Platelets: 334 10*3/uL (ref 150–400)
RBC: 4.93 MIL/uL (ref 3.87–5.11)
RDW: 18.8 % — ABNORMAL HIGH (ref 11.5–15.5)
WBC: 8.4 10*3/uL (ref 4.0–10.5)
nRBC: 0 % (ref 0.0–0.2)

## 2021-02-20 LAB — RAPID URINE DRUG SCREEN, HOSP PERFORMED
Amphetamines: NOT DETECTED
Barbiturates: NOT DETECTED
Benzodiazepines: NOT DETECTED
Cocaine: NOT DETECTED
Opiates: NOT DETECTED
Tetrahydrocannabinol: NOT DETECTED

## 2021-02-20 LAB — COMPREHENSIVE METABOLIC PANEL
ALT: 18 U/L (ref 0–44)
AST: 20 U/L (ref 15–41)
Albumin: 3.4 g/dL — ABNORMAL LOW (ref 3.5–5.0)
Alkaline Phosphatase: 65 U/L (ref 38–126)
Anion gap: 8 (ref 5–15)
BUN: 13 mg/dL (ref 6–20)
CO2: 25 mmol/L (ref 22–32)
Calcium: 8.8 mg/dL — ABNORMAL LOW (ref 8.9–10.3)
Chloride: 105 mmol/L (ref 98–111)
Creatinine, Ser: 0.97 mg/dL (ref 0.44–1.00)
GFR, Estimated: >60 mL/min (ref >60)
Glucose, Bld: 130 mg/dL — ABNORMAL HIGH (ref 70–99)
Potassium: 3.6 mmol/L (ref 3.5–5.1)
Sodium: 138 mmol/L (ref 135–145)
Total Bilirubin: 0.5 mg/dL (ref 0.3–1.2)
Total Protein: 7 g/dL (ref 6.5–8.1)

## 2021-02-20 LAB — I-STAT BETA HCG BLOOD, ED (MC, WL, AP ONLY): I-stat hCG, quantitative: <5 m[IU]/mL (ref <5)

## 2021-02-20 LAB — RESP PANEL BY RT-PCR (FLU A&B, COVID) ARPGX2
Influenza A by PCR: NEGATIVE
Influenza B by PCR: NEGATIVE
SARS Coronavirus 2 by RT PCR: NEGATIVE

## 2021-02-20 LAB — CBG MONITORING, ED: Glucose-Capillary: 139 mg/dL — ABNORMAL HIGH (ref 70–99)

## 2021-02-20 LAB — ACETAMINOPHEN LEVEL: Acetaminophen (Tylenol), Serum: <10 ug/mL — ABNORMAL LOW (ref 10–30)

## 2021-02-20 LAB — ETHANOL: Alcohol, Ethyl (B): <10 mg/dL (ref <10)

## 2021-02-20 LAB — SALICYLATE LEVEL: Salicylate Lvl: <7 mg/dL — ABNORMAL LOW (ref 7.0–30.0)

## 2021-02-20 MED ORDER — MAGNESIUM HYDROXIDE 400 MG/5ML PO SUSP: 30.0000 mL | Freq: Every day | ORAL | Status: DC | PRN

## 2021-02-20 MED ORDER — ALUM & MAG HYDROXIDE-SIMETH 200-200-20 MG/5ML PO SUSP: 30.0000 mL | ORAL | Status: DC | PRN

## 2021-02-20 MED ORDER — POTASSIUM CHLORIDE CRYS ER 20 MEQ PO TBCR
20.0000 meq | EXTENDED_RELEASE_TABLET | Freq: Once | ORAL | Status: AC
  Administered 2021-02-20: 20 meq via ORAL
  Filled 2021-02-20: qty 1

## 2021-02-20 MED ORDER — LORAZEPAM 1 MG PO TABS
1.0000 mg | ORAL_TABLET | Freq: Four times a day (QID) | ORAL | Status: AC | PRN
  Administered 2021-02-21 – 2021-02-26 (×3): 1 mg via ORAL
  Filled 2021-02-20 (×3): qty 1

## 2021-02-20 MED ORDER — ZIPRASIDONE MESYLATE 20 MG IM SOLR: 20.0000 mg | Freq: Two times a day (BID) | INTRAMUSCULAR | Status: AC | PRN

## 2021-02-20 MED ORDER — TRAZODONE HCL 50 MG PO TABS
50.0000 mg | ORAL_TABLET | Freq: Every evening | ORAL | Status: DC | PRN
  Administered 2021-02-20 – 2021-02-22 (×3): 50 mg via ORAL
  Filled 2021-02-20 (×3): qty 1

## 2021-02-20 MED ORDER — RISPERIDONE 1 MG PO TBDP
1.0000 mg | ORAL_TABLET | Freq: Two times a day (BID) | ORAL | Status: DC
  Administered 2021-02-20 – 2021-02-22 (×4): 1 mg via ORAL
  Filled 2021-02-20 (×9): qty 1

## 2021-02-20 MED ORDER — RISPERIDONE 2 MG PO TBDP
2.0000 mg | ORAL_TABLET | Freq: Three times a day (TID) | ORAL | Status: DC | PRN
  Administered 2021-02-21 – 2021-02-24 (×2): 2 mg via ORAL
  Filled 2021-02-20: qty 1

## 2021-02-20 MED ORDER — ACETAMINOPHEN 325 MG PO TABS: 650.0000 mg | ORAL_TABLET | Freq: Four times a day (QID) | ORAL | Status: DC | PRN

## 2021-02-20 NOTE — BH Assessment (Addendum)
Comprehensive Clinical Assessment (CCA) Screening, Triage and Referral Note  02/20/2021 Ashley Vega [id number]  DISPOSITION: Per [name], NP, pt recommended for inpatient treatment. Per NP, per [facility] [name], pt is accepted to [facility] and can come over after 730 02/20/21.   The patient demonstrates the following risk factors for suicide: Chronic risk factors for suicide include: psychiatric disorder of schizophrenia . Acute risk factors for suicide include: unemployment, social withdrawal/isolation, and loss (financial, interpersonal, professional). Protective factors for this patient include: positive therapeutic relationship and hope for the future. Considering these factors, the overall suicide risk at this point appears to be low. Patient is appropriate for outpatient follow up.  [facility] ED from 02/20/2021 in [place] No Risk       Patient presented to the [facility] voluntarily and unaccompanied complaining of auditory hallucinations. Patient stated she first heard "voices" about a year ago and then, they went away. Patient stated they returned about a week ago. Patient stated she believes she needs to be admitted for inpatient psychiatric treatment. Patient did state that hearing the voices made her "want to give up" however, patient denied any suicidal thoughts or plans. Pt denied HI, VH and NSSH. Pt did seem to possibly be responding to internal stimuli at times. She sometimes looked off in the distance at nothing in particular and often paused as if listening before answering a question. When asked, patient denied hearing the voices during the assessment. Hallucinations were auditory hallucinations without a command per patient. Patient stated that the voices "say mean stuff."  Father is primary support. She stated she lives with him. Her mother died in 2020-10-07 and patient stated she still feels sad most days as a result.  Patient was unable to answer questions regarding specific symptoms. She did state that she felt sad about her mother's death and felt "like giving up" due to the voices she hears. Patient stated she worries about the voices she hears. She did not elaborate.   Patient stated she is unemployed. Patient stated she sees [name] at [place] weekly for outpatient therapy. Patient confirmed that she has been psychiatrically admitted 2 times, in 2017 and 2019. Patient stated in 2017 she had been diagnosed with schizophrenia. Patient stated she had been prescribed medications in 2019 but they had run out and she has not been taking them in quite a while. She could not specify which medications she was prescribed or by whom.   Patient was of tall stature, overweight and normal build with normal grooming and casual dress. Posture/gait, movement, concentration, and memory within normal limits. Normal attention and concentration and oriented to person, time, place and situation. Mood was blunted and affect was congruent with mood. Normal eye contact and responsive facial expressions. Patient was cooperative and a bit guarded although forthcoming with information when asked. Speech, thought content and organization was within normal limits. Appeared to have average intelligence with poor judgment and insight but within normal limits for age.    Chief Complaint:  Chief Complaint  Patient presents with   Hallucinations   Visit Diagnosis:  Schizophrenia  Patient Reported Information How did you hear about [place]? Self  What Is the Reason for Your Visit/Call Today? Patient presented to the [facility] voluntarily and unaccompanied complaining of auditory hallucinations.  How Long Has This Been Causing You Problems? > than 6 months  What Do You Feel Would Help You the Most Today? -- (  Patient stated she believes she needs to be admitted for inpatient psychiatric treatment.)   Have You Recently Had  Any Thoughts About Hurting Yourself? No (Patient did state that hearing the voices made her "want to give up" however, patient denied any suicidal thoughts or plans.)  Are You Planning to Commit Suicide/Harm Yourself At This time? No   Have you Recently Had Thoughts About [place]? No  Are You Planning to Harm Someone at This Time? No  Explanation: No data recorded  Have You Used Any Alcohol or Drugs in the Past 24 Hours? No  How Long Ago Did You Use Drugs or Alcohol? No data recorded What Did You Use and How Much? No data recorded  Do You Currently Have a Therapist/Psychiatrist? Yes  Name of Therapist/Psychiatrist: Patient stated she sees [name] at [place] weekly for outpatient therapy. Patient stated she ran out of her medications some time ago and has not taken her medicaitons in months.   Have You Been Recently Discharged From Any Office Practice or Programs? No  Explanation of Discharge From Practice/Program: No data recorded   CCA Screening Triage Referral Assessment Type of Contact: Tele-Assessment  Telemedicine Service Delivery:   Is this Initial or Reassessment? Initial Assessment  Date Telepsych consult ordered in [facility]:  No data recorded Time Telepsych consult ordered in [facility]:  No data recorded Location of Assessment: [facility] ED  Provider Location: [facility] Assessment Services   Collateral Involvement: none   Does Patient Have a [place]? No data recorded Name and Contact of Legal Guardian: No data recorded If Minor and Not Living with Parent(s), Who has Custody? No data recorded Is CPS involved or ever been involved? -- (UTA)  Is APS involved or ever been involved? Never   Patient Determined To Be At Risk for Harm To Self or Others Based on Review of Patient Reported Information or Presenting Complaint? No  Method: No data recorded Availability of Means: No data recorded Intent: No data  recorded Notification Required: No data recorded Additional Information for Danger to Others Potential: No data recorded Additional Comments for Danger to Others Potential: No data recorded Are There Guns or Other Weapons in Your Home? No data recorded Types of Guns/Weapons: No data recorded Are These Weapons Safely Secured?                            No data recorded Who Could Verify You Are Able To Have These Secured: No data recorded Do You Have any Outstanding Charges, Pending Court Dates, Parole/Probation? No data recorded Contacted To Inform of Risk of Harm To Self or Others: -- (none)   Does Patient Present under Involuntary Commitment? No  IVC Papers Initial File Date: No data recorded  [place] of Residence: [place]   Patient Currently Receiving the Following Services: Individual Therapy   Determination of Need: Emergent (2 hours)   Options For Referral: Inpatient Hospitalization (Per [name], NP, pt is recommended for inpatient treatment. NP is reviewing labs and medical information and reviewing case with [facility] to see if there is an appropriate available bed there.)   Discharge Disposition:     Fuller Mandril, Counselor  Loyal Jacobson. Mare Ferrari, [place], [facility], [facility] Triage Specialist [facility]

## 2021-02-20 NOTE — Progress Notes (Signed)
Pt accepted to [facility]     Patient meets inpatient criteria per [name], NP  Dr.Clary is the attending provider.    Call report to  [phone]    Roselind Rily, RN @ [facility] ED notified.     Pt scheduled  to arrive at [facility] by 12 Noon.   Mariea Clonts, MSW, LCSW-A  9:34 AM 02/20/2021

## 2021-02-20 NOTE — ED Triage Notes (Signed)
Patient reports she is having auditory and visual hallucinations x 1 week, denies SI/HI

## 2021-02-20 NOTE — ED Notes (Signed)
Attempted report to [facility]. RN is currently on progression meeting and will be done at 0915AM. Will call back again.

## 2021-02-20 NOTE — Tx Team (Signed)
Initial Treatment Plan 02/20/2021 12:36 PM [name] WPT:[id number]    PATIENT STRESSORS: Health problems Occupational concerns   PATIENT STRENGTHS: Ability for insight Communication skills Physical Health Supportive family/friends   PATIENT IDENTIFIED PROBLEMS: anxiety  depression  Auditory hallucinations                 DISCHARGE CRITERIA:  Ability to meet basic life and health needs Improved stabilization in mood, thinking, and/or behavior Motivation to continue treatment in a less acute level of care Need for constant or close observation no longer present  PRELIMINARY DISCHARGE PLAN: Attend aftercare/continuing care group Return to previous living arrangement  PATIENT/FAMILY INVOLVEMENT: This treatment plan has been presented to and reviewed with the patient, Ashley Vega.  The patient and family have been given the opportunity to ask questions and make suggestions.  Baron Sane, RN 02/20/2021, 12:36 PM

## 2021-02-20 NOTE — ED Notes (Signed)
Breakfast orders placed 

## 2021-02-20 NOTE — Progress Notes (Signed)
Recreation Therapy Notes  INPATIENT RECREATION THERAPY ASSESSMENT  Patient Details Name: Ashley Vega MRN: [id number] DOB: July 10, 1991 Today's Date: 02/20/2021       Information Obtained From: Patient  Able to Participate in Assessment/Interview: Yes  Patient Presentation: Alert (Smiling)  Reason for Admission (Per Patient): Other (Comments) ("trying to get blood pressure and everything checked out")  Patient Stressors:  (None identified)  Coping Skills:    (None identified)  Leisure Interests (2+):  Individual - TV  Frequency of Recreation/Participation: Weekly  Awareness of Community Resources:  No  Community Resources:     Current Use:    If no, Barriers?:    Expressed Interest in [place]: No  County of Residence:  [place]  Patient Main Form of Transportation: Diplomatic Services operational officer  Patient Strengths:  Reliable  Patient Identified Areas of Improvement:  "not that I can see"  Patient Goal for Hospitalization:  "take shots to stop hearing voices"  Current SI (including self-harm):  No  Current HI:  No  Current AVH: Yes (Hearing a voice say apologize)  Staff Intervention Plan: Group Attendance, Collaborate with Interdisciplinary Treatment Team  Consent to Intern Participation: N/A   Victorino Sparrow, LRT/CTRS  Ria Comment, Yamilee Harmes A 02/20/2021, 1:55 PM

## 2021-02-20 NOTE — ED Notes (Signed)
Report given to [facility] in [facility]. Pt is going by Ashley Vega. Pt has 2 belonging bags which both were given to pt at this time. Pt remains alert and oriented x 4. Pending call from Ashley Vega.

## 2021-02-20 NOTE — BHH Group Notes (Signed)
LCSW Group Therapy Note   02/20/2021    Type of Therapy and Topic:  Group Therapy - Healthy vs Unhealthy Coping Skills   Participation Level:  Active   Description of Group The focus of this group was to determine what unhealthy coping techniques typically are used by group members and what healthy coping techniques would be helpful in coping with various problems. Patients were guided in becoming aware of the differences between healthy and unhealthy coping techniques. Patients were asked to identify 2-3 healthy coping skills they would like to learn to use more effectively.   Therapeutic Goals 1. Patients learned that coping is what human beings do all day long to deal with various situations in their lives 2. Patients defined and discussed healthy vs unhealthy coping techniques 3. Patients identified their preferred coping techniques and identified whether these were healthy or unhealthy 4. Patients determined 2-3 healthy coping skills they would like to become more familiar with and use more often. 5. Patients provided support and ideas to each other     Summary of Patient Progress:  During group, pt expressed that she enjoys playing video games as a coping skill. Patient proved open to input from peers and feedback from [place]. Patient demonstrated insight into the subject matter, was respectful of peers, and participated throughout the entire session.     Therapeutic Modalities Cognitive Behavioral Therapy Motivational Interviewing

## 2021-02-20 NOTE — H&P (Signed)
Psychiatric Admission Assessment Adult  Patient Identification: Ashley Vega MRN:  [id number] Date of Evaluation:  02/20/2021 Chief Complaint:  Schizophrenia ([place]) [F20.9] Principal Diagnosis: <principal problem not specified> Diagnosis:  Active Problems:   Schizophrenia ([place])  History of Present Illness: Patient is seen and examined.  Patient is a 30 year old female with reported past psychiatric history significant for schizophrenia who originally presented to the [facility] emergency department on 02/19/2021 with auditory hallucinations.  The patient stated at that time the voices have been telling her bad things about her self.  She denied any suicidal or homicidal ideation.  She was seen by the comprehensive clinical assessment team and complained of auditory hallucinations there.  She stated that she had first heard the voices approximately a year ago, and then "they went away".  She stated that they returned approximately a week ago.  She stated that they told her that she should "give up".  She denied any suicidal or homicidal ideation.  Apparently her father is her primary support, and she lives with him.  Her mother died apparently in 10/03/20.  The patient is a terrible historian, and the history she gives is quite vague, and the majority of it came from the electronic medical record.  She apparently sees primary [facility] [place].  She stated on more than one occasion today that she was looking for "a new primary".  She had been hospitalized at [facility] in [place] in 10/03/2017.  Her diagnosis at that time was paranoid schizophrenia.  She was discharged on Risperdal 1 mg p.o. twice daily.  Although the patient stated that she had not been referred to psychiatry after she left [facility] she mentioned that she had been to [facility], and that they had given her an unspecified long-acting injectable medication.  She stated today that she went to "[facility] to  get the Abilify injection".  She admitted to auditory hallucinations, she appeared to be quite guarded and paranoid.  She was gazing around the room.  Review of the notes from [facility] she was vague there as well.  She mentioned something about the Abilify at that point and mentioned having "adverse effects".  She stated that at that time "it messed up my health and increase my anxiety".  She reported that she had been at [facility] for 7 days in 2017.  At the end of that hospitalization she was discharged on Risperdal and trazodone.  PMP database is essentially negative.  She was admitted to the hospital for evaluation and stabilization.  No collateral information was obtained by the comprehensive clinical service.  Associated Signs/Symptoms: Depression Symptoms:  anhedonia, insomnia, disturbed sleep, Duration of Depression Symptoms: No data recorded (Hypo) Manic Symptoms:  Delusions, Distractibility, Hallucinations, Impulsivity, Irritable Mood, Anxiety Symptoms:  Excessive Worry, Psychotic Symptoms:  Delusions, Hallucinations: Auditory Paranoia, PTSD Symptoms: Negative Total Time spent with patient: 30 minutes  Past Psychiatric History: Reportedly she has had 2 previous psychiatric admissions.  She was admitted to [place] in 10-03-17, and also had some form of a stay at [facility] in 2017.  She was therefore 7 days.  As best as I can tell she has been treated with Abilify, Risperdal and trazodone.  Is the patient at risk to self? No.  Has the patient been a risk to self in the past 6 months? No.  Has the patient been a risk to self within the distant past? No.  Is the patient a risk to  others? No.  Has the patient been a risk to others in the past 6 months? No.  Has the patient been a risk to others within the distant past? No.   Prior Inpatient Therapy:   Prior Outpatient Therapy:    Alcohol Screening: 1. How often do you have a drink containing alcohol?: Never 2. How  many drinks containing alcohol do you have on a typical day when you are drinking?: 1 or 2 3. How often do you have six or more drinks on one occasion?: Never AUDIT-C Score: 0 4. How often during the last year have you found that you were not able to stop drinking once you had started?: Never 5. How often during the last year have you failed to do what was normally expected from you because of drinking?: Never 6. How often during the last year have you needed a first drink in the morning to get yourself going after a heavy drinking session?: Never 7. How often during the last year have you had a feeling of guilt of remorse after drinking?: Never 8. How often during the last year have you been unable to remember what happened the night before because you had been drinking?: Never 9. Have you or someone else been injured as a result of your drinking?: No 10. Has a relative or friend or a doctor or another health worker been concerned about your drinking or suggested you cut down?: No Alcohol Use Disorder Identification Test Final Score (AUDIT): 0 Substance Abuse History in the last 12 months:  No. Consequences of Substance Abuse: Negative Previous Psychotropic Medications: Yes  Psychological Evaluations: Yes  Past Medical History:  Past Medical History:  Diagnosis Date   Fibroids    No pertinent past medical history     Past Surgical History:  Procedure Laterality Date   NO PAST SURGERIES     Family History:  Family History  Problem Relation Age of Onset   Cancer Maternal Grandmother    Hypertension Maternal Grandfather    Kidney disease Maternal Grandfather    Other Neg Hx    Family Psychiatric  History: Denied.  Patient's mother is expired.  She lives with her father. Tobacco Screening: Have you used any form of tobacco in the last 30 days? (Cigarettes, Smokeless Tobacco, Cigars, and/or Pipes): No Social History:  Social History   Substance and Sexual Activity  Alcohol Use No      Social History   Substance and Sexual Activity  Drug Use No    Additional Social History:                           Allergies:   Allergies  Allergen Reactions   Peanut-Containing Drug Products Anaphylaxis   Lab Results:  Results for orders placed or performed during the hospital encounter of 02/20/21 (from the past 48 hour(s))  Resp Panel by RT-PCR (Flu A&B, Covid) Nasopharyngeal Swab     Status: None   Collection Time: 02/19/21 11:11 PM   Specimen: Nasopharyngeal Swab; Nasopharyngeal(NP) swabs in vial transport medium  Result Value Ref Range   SARS Coronavirus 2 by RT PCR NEGATIVE NEGATIVE    Comment: (NOTE) SARS-CoV-2 target nucleic acids are NOT DETECTED.  The SARS-CoV-2 RNA is generally detectable in upper respiratory specimens during the acute phase of infection. The lowest concentration of SARS-CoV-2 viral copies this assay can detect is 138 copies/mL. A negative result does not preclude SARS-Cov-2 infection and should not be  used as the sole basis for treatment or other patient management decisions. A negative result may occur with  improper specimen collection/handling, submission of specimen other than nasopharyngeal swab, presence of viral mutation(s) within the areas targeted by this assay, and inadequate number of viral copies(<138 copies/mL). A negative result must be combined with clinical observations, patient history, and epidemiological information. The expected result is Negative.  Fact Sheet for Patients:  EntrepreneurPulse.com.au  Fact Sheet for Healthcare Providers:  IncredibleEmployment.be  This test is no t yet approved or cleared by the [place] FDA and  has been authorized for detection and/or diagnosis of SARS-CoV-2 by FDA under an Emergency Use Authorization (EUA). This EUA will remain  in effect (meaning this test can be used) for the duration of the COVID-19 declaration under Section  564(b)(1) of the Act, 21 U.S.C.section 360bbb-3(b)(1), unless the authorization is terminated  or revoked sooner.       Influenza A by PCR NEGATIVE NEGATIVE   Influenza B by PCR NEGATIVE NEGATIVE    Comment: (NOTE) The Xpert Xpress SARS-CoV-2/FLU/RSV plus assay is intended as an aid in the diagnosis of influenza from Nasopharyngeal swab specimens and should not be used as a sole basis for treatment. Nasal washings and aspirates are unacceptable for Xpert Xpress SARS-CoV-2/FLU/RSV testing.  Fact Sheet for Patients: EntrepreneurPulse.com.au  Fact Sheet for Healthcare Providers: IncredibleEmployment.be  This test is not yet approved or cleared by the [place] FDA and has been authorized for detection and/or diagnosis of SARS-CoV-2 by FDA under an Emergency Use Authorization (EUA). This EUA will remain in effect (meaning this test can be used) for the duration of the COVID-19 declaration under Section 564(b)(1) of the Act, 21 U.S.C. section 360bbb-3(b)(1), unless the authorization is terminated or revoked.  Performed at [facility], [place] [address] [place], [place] [address]   Urine rapid drug screen (hosp performed)     Status: None   Collection Time: 02/20/21  1:09 AM  Result Value Ref Range   Opiates NONE DETECTED NONE DETECTED   Cocaine NONE DETECTED NONE DETECTED   Benzodiazepines NONE DETECTED NONE DETECTED   Amphetamines NONE DETECTED NONE DETECTED   Tetrahydrocannabinol NONE DETECTED NONE DETECTED   Barbiturates NONE DETECTED NONE DETECTED    Comment: (NOTE) DRUG SCREEN FOR MEDICAL PURPOSES ONLY.  IF CONFIRMATION IS NEEDED FOR ANY PURPOSE, NOTIFY LAB WITHIN 5 DAYS.  LOWEST DETECTABLE LIMITS FOR URINE DRUG SCREEN Drug Class                     Cutoff (ng/mL) Amphetamine and metabolites    1000 Barbiturate and metabolites    200 Benzodiazepine                 259 Tricyclics and metabolites     300 Opiates and  metabolites        300 Cocaine and metabolites        300 THC                            50 Performed at [facility], [place] [address] [place], [place] [address]   Comprehensive metabolic panel     Status: Abnormal   Collection Time: 02/20/21  1:41 AM  Result Value Ref Range   Sodium 138 135 - 145 mmol/L   Potassium 3.6 3.5 - 5.1 mmol/L   Chloride 105 98 - 111 mmol/L   CO2 25 22 -  32 mmol/L   Glucose, Bld 130 (H) 70 - 99 mg/dL    Comment: Glucose reference range applies only to samples taken after fasting for at least 8 hours.   BUN 13 6 - 20 mg/dL   Creatinine, Ser 0.97 0.44 - 1.00 mg/dL   Calcium 8.8 (L) 8.9 - 10.3 mg/dL   Total Protein 7.0 6.5 - 8.1 g/dL   Albumin 3.4 (L) 3.5 - 5.0 g/dL   AST 20 15 - 41 U/L   ALT 18 0 - 44 U/L   Alkaline Phosphatase 65 38 - 126 U/L   Total Bilirubin 0.5 0.3 - 1.2 mg/dL   GFR, Estimated >60 >60 mL/min    Comment: (NOTE) Calculated using the CKD-EPI Creatinine Equation (2021)    Anion gap 8 5 - 15    Comment: Performed at [facility], [place] [address] [place], [place] [address]  Salicylate level     Status: Abnormal   Collection Time: 02/20/21  1:41 AM  Result Value Ref Range   Salicylate Lvl <6.7 (L) 7.0 - 30.0 mg/dL    Comment: Performed at [facility] [address] [place], [place] [address]  Acetaminophen level     Status: Abnormal   Collection Time: 02/20/21  1:41 AM  Result Value Ref Range   Acetaminophen (Tylenol), Serum <10 (L) 10 - 30 ug/mL    Comment: (NOTE) Therapeutic concentrations vary significantly. A range of 10-30 ug/mL  may be an effective concentration for many patients. However, some  are best treated at concentrations outside of this range. Acetaminophen concentrations >150 ug/mL at 4 hours after ingestion  and >50 ug/mL at 12 hours after ingestion are often associated with  toxic reactions.  Performed at [facility], [place] [address], [place], [place] [address]   Ethanol     Status:  None   Collection Time: 02/20/21  1:41 AM  Result Value Ref Range   Alcohol, Ethyl (B) <10 <10 mg/dL    Comment: (NOTE) Lowest detectable limit for serum alcohol is 10 mg/dL.  For medical purposes only. Performed at [facility], [place] [address], [place], [place] [address]   CBC WITH DIFFERENTIAL     Status: Abnormal   Collection Time: 02/20/21  1:41 AM  Result Value Ref Range   WBC 8.4 4.0 - 10.5 K/uL   RBC 4.93 3.87 - 5.11 MIL/uL   Hemoglobin 10.0 (L) 12.0 - 15.0 g/dL   HCT 35.8 (L) 36.0 - 46.0 %   MCV 72.6 (L) 80.0 - 100.0 fL   MCH 20.3 (L) 26.0 - 34.0 pg   MCHC 27.9 (L) 30.0 - 36.0 g/dL   RDW 18.8 (H) 11.5 - 15.5 %   Platelets 334 150 - 400 K/uL   nRBC 0.0 0.0 - 0.2 %   Neutrophils Relative % 55 %   Neutro Abs 4.6 1.7 - 7.7 K/uL   Lymphocytes Relative 35 %   Lymphs Abs 2.9 0.7 - 4.0 K/uL   Monocytes Relative 9 %   Monocytes Absolute 0.8 0.1 - 1.0 K/uL   Eosinophils Relative 1 %   Eosinophils Absolute 0.1 0.0 - 0.5 K/uL   Basophils Relative 0 %   Basophils Absolute 0.0 0.0 - 0.1 K/uL   Immature Granulocytes 0 %   Abs Immature Granulocytes 0.03 0.00 - 0.07 K/uL    Comment: Performed at [facility], [place] [address] [place], [place] [address]  I-Stat beta hCG blood, ED  Status: None   Collection Time: 02/20/21  1:46 AM  Result Value Ref Range   I-stat hCG, quantitative <5.0 <5 mIU/mL   Comment 3            Comment:   GEST. AGE      CONC.  (mIU/mL)   <=1 WEEK        5 - 50     2 WEEKS       50 - 500     3 WEEKS       100 - 10,000     4 WEEKS     1,000 - 30,000        FEMALE AND NON-PREGNANT FEMALE:     LESS THAN 5 mIU/mL   CBG monitoring, ED     Status: Abnormal   Collection Time: 02/20/21  1:49 AM  Result Value Ref Range   Glucose-Capillary 139 (H) 70 - 99 mg/dL    Comment: Glucose reference range applies only to samples taken after fasting for at least 8 hours.    Blood Alcohol level:  Lab Results  Component Value Date   ETH <10 02/20/2021    ETH <10 27/25/3664    Metabolic Disorder Labs:  No results found for: HGBA1C, MPG No results found for: PROLACTIN No results found for: CHOL, TRIG, HDL, CHOLHDL, VLDL, LDLCALC  Current Medications: Current Facility-Administered Medications  Medication Dose Route Frequency Provider Last Rate Last Admin   acetaminophen (TYLENOL) tablet 650 mg  650 mg Oral Q6H PRN Sharma Covert, MD       alum & mag hydroxide-simeth (MAALOX/MYLANTA) 200-200-20 MG/5ML suspension 30 mL  30 mL Oral Q4H PRN Sharma Covert, MD       risperiDONE (RISPERDAL M-TABS) disintegrating tablet 2 mg  2 mg Oral Q8H PRN Sharma Covert, MD       And   LORazepam (ATIVAN) tablet 1 mg  1 mg Oral Q6H PRN Sharma Covert, MD       And   ziprasidone (GEODON) injection 20 mg  20 mg Intramuscular Q12H PRN Sharma Covert, MD       magnesium hydroxide (MILK OF MAGNESIA) suspension 30 mL  30 mL Oral Daily PRN Sharma Covert, MD       risperiDONE (RISPERDAL M-TABS) disintegrating tablet 1 mg  1 mg Oral BID Sharma Covert, MD   1 mg at 02/20/21 1131   traZODone (DESYREL) tablet 50 mg  50 mg Oral QHS PRN Sharma Covert, MD       PTA Medications: Medications Prior to Admission  Medication Sig Dispense Refill Last Dose   acetaminophen (TYLENOL) 325 MG tablet Take 650 mg by mouth every 6 (six) hours as needed for mild pain or headache.      ibuprofen (ADVIL) 400 MG tablet Take 400 mg by mouth every 6 (six) hours as needed for mild pain or headache.      ABILIFY 10 MG tablet Take 10 mg by mouth daily.      RISPERDAL 1 MG tablet Take 1 mg by mouth at bedtime.       Musculoskeletal: Strength & Muscle Tone: within normal limits Gait & Station: normal Patient leans: N/A            Psychiatric Specialty Exam:  Presentation  General Appearance: Fairly Groomed  Eye Contact:Fair  Speech:Normal Rate  Speech Volume:Decreased  Handedness:Right   Mood and Affect   Mood:Dysphoric  Affect:Congruent   Thought Process  Thought Processes:Goal  Directed  Duration of Psychotic Symptoms: Greater than six months  Past Diagnosis of Schizophrenia or Psychoactive disorder: Yes  Descriptions of Associations:Circumstantial  Orientation:Full (Time, Place and Person)  Thought Content:Delusions; Paranoid Ideation  Hallucinations:Hallucinations: Auditory  Ideas of Reference:Delusions; Paranoia  Suicidal Thoughts:Suicidal Thoughts: No  Homicidal Thoughts:Homicidal Thoughts: No   Sensorium  Memory:Immediate Poor; Recent Poor; Remote Poor  Judgment:Impaired  Insight:Lacking   Executive Functions  Concentration:Fair  Attention Span:Fair  Recall:Poor  Fund of Knowledge:Fair  Language:Fair   Psychomotor Activity  Psychomotor Activity:Psychomotor Activity: Increased   Assets  Assets:Desire for Improvement; Resilience; Social Support; Housing   Sleep  Sleep:Sleep: Poor    Physical Exam: Physical Exam Vitals and nursing note reviewed.  HENT:     Head: Normocephalic and atraumatic.  Pulmonary:     Effort: Pulmonary effort is normal.  Neurological:     General: No focal deficit present.     Mental Status: She is alert.   Review of Systems  All other systems reviewed and are negative. Height 5\' 6"  (1.676 m), weight 99.8 kg, last menstrual period 02/18/2021. Body mass index is 35.51 kg/m.  Treatment Plan Summary: Patient is seen and examined.  Patient is a 30 year old female with the above-stated past psychiatric history who was admitted with psychosis.  She will be admitted to the hospital.  She will be integrated in the milieu.  She will be encouraged to attend groups.  Given the very limited information that we have I am just going to go and restart her on Risperdal 1 mg p.o. twice daily.  She will also have trazodone available as needed as well as hydroxyzine.  We will have social work attempt to get in contact with her  father, and will also have pharmacy contact [facility] to see if there is a more accurate list of medications.  I am hesitant to start the Abilify long-acting injectable at this point given what is in the notes about her previously having had side effects to it.  Review of her admission laboratories revealed essentially normal electrolytes with a mildly elevated glucose at 130.  Liver function enzymes were normal.  Creatinine was normal.  She does have an anemia with a hemoglobin of 10 and hematocrit of 35.8.  Her MCV, MCH and MCHC are all low.  Platelets were 334,000.  Differential was normal.  Acetaminophen was less than 10, salicylate less than 7.  Beta-hCG was negative.  Respiratory panel was negative for influenza A, B and coronavirus.  Blood alcohol was less than 10.  Drug screen was negative.  Her vital signs are stable, she is afebrile.    Observation Level/Precautions:  15 minute checks  Laboratory:  CBC Chemistry Profile Folic Acid HbAIC HCG UDS  Psychotherapy:    Medications:    Consultations:    Discharge Concerns:    Estimated LOS:  Other:     Physician Treatment Plan for Primary Diagnosis: <principal problem not specified> Long Term Goal(s): Improvement in symptoms so as ready for discharge  Short Term Goals: Ability to identify changes in lifestyle to reduce recurrence of condition will improve, Ability to verbalize feelings will improve, Ability to demonstrate self-control will improve, Ability to identify and develop effective coping behaviors will improve, Ability to maintain clinical measurements within normal limits will improve, and Compliance with prescribed medications will improve  Physician Treatment Plan for Secondary Diagnosis: Active Problems:   Schizophrenia ([place])  Long Term Goal(s): Improvement in symptoms so as ready for discharge  Short Term Goals: Ability  to identify changes in lifestyle to reduce recurrence of condition will improve, Ability to verbalize  feelings will improve, Ability to demonstrate self-control will improve, Ability to identify and develop effective coping behaviors will improve, Ability to maintain clinical measurements within normal limits will improve, and Compliance with prescribed medications will improve  I certify that inpatient services furnished can reasonably be expected to improve the patient's condition.    Sharma Covert, MD 6/20/20224:21 PM

## 2021-02-20 NOTE — BHH Suicide Risk Assessment (Signed)
[facility] Admission Suicide Risk Assessment   Nursing information obtained from:    Demographic factors:    Current Mental Status:    Loss Factors:    Historical Factors:    Risk Reduction Factors:     Total Time spent with patient: 30 minutes Principal Problem: <principal problem not specified> Diagnosis:  Active Problems:   Schizophrenia ([place])  Subjective Data: Patient is seen and examined.  Patient is a 30 year old female with reported past psychiatric history significant for schizophrenia who originally presented to the [facility] emergency department on 02/19/2021 with auditory hallucinations.  The patient stated at that time the voices have been telling her bad things about her self.  She denied any suicidal or homicidal ideation.  She was seen by the comprehensive clinical assessment team and complained of auditory hallucinations there.  She stated that she had first heard the voices approximately a year ago, and then "they went away".  She stated that they returned approximately a week ago.  She stated that they told her that she should "give up".  She denied any suicidal or homicidal ideation.  Apparently her father is her primary support, and she lives with him.  Her mother died apparently in October 19, 2020.  The patient is a terrible historian, and the history she gives is quite vague, and the majority of it came from the electronic medical record.  She apparently [facility] [place].  She stated on more than one occasion today that she was looking for "a new primary".  She had been hospitalized at [facility] in [place] in 19-Oct-2017.  Her diagnosis at that time was paranoid schizophrenia.  She was discharged on Risperdal 1 mg p.o. twice daily.  Although the patient stated that she had not been referred to psychiatry after she left [facility] she mentioned that she had been to [facility], and that they had given her an unspecified long-acting injectable  medication.  She stated today that she went to "[facility] to get the Abilify injection".  She admitted to auditory hallucinations, she appeared to be quite guarded and paranoid.  She was gazing around the room.  Review of the notes from [facility] she was vague there as well.  She mentioned something about the Abilify at that point and mentioned having "adverse effects".  She stated that at that time "it messed up my health and increase my anxiety".  She reported that she had been at [facility] for 7 days in 2017.  At the end of that hospitalization she was discharged on Risperdal and trazodone.  PMP database is essentially negative.  She was admitted to the hospital for evaluation and stabilization.  No collateral information was obtained by the comprehensive clinical service.  Continued Clinical Symptoms:    The "Alcohol Use Disorders Identification Test", Guidelines for Use in Primary Care, Second Edition.  World Pharmacologist [facility]). Score between 0-7:  no or low risk or alcohol related problems. Score between 8-15:  moderate risk of alcohol related problems. Score between 16-19:  high risk of alcohol related problems. Score 20 or above:  warrants further diagnostic evaluation for alcohol dependence and treatment.   CLINICAL FACTORS:   Schizophrenia:   Less than 98 years old Paranoid or undifferentiated type Currently Psychotic   Musculoskeletal: Strength & Muscle Tone: within normal limits Gait & Station: normal Patient leans: N/A  Psychiatric Specialty Exam:  Presentation  General Appearance: Fairly Groomed  Eye Contact:Fair  Speech:Normal Rate  Speech Volume:Decreased  Handedness:Right   Mood and Affect  Mood:Dysphoric  Affect:Congruent   Thought Process  Thought Processes:Goal Directed  Descriptions of Associations:Circumstantial  Orientation:Full (Time, Place and Person)  Thought Content:Delusions; Paranoid Ideation  History of Schizophrenia/Schizoaffective  disorder:Yes  Duration of Psychotic Symptoms:Greater than six months  Hallucinations:Hallucinations: Auditory  Ideas of Reference:Delusions; Paranoia  Suicidal Thoughts:Suicidal Thoughts: No  Homicidal Thoughts:Homicidal Thoughts: No   Sensorium  Memory:Immediate Poor; Recent Poor; Remote Poor  Judgment:Impaired  Insight:Lacking   Executive Functions  Concentration:Fair  Attention Span:Fair  Recall:Poor  Fund of Knowledge:Fair  Language:Fair   Psychomotor Activity  Psychomotor Activity:Psychomotor Activity: Increased   Assets  Assets:Desire for Improvement; Resilience; Social Support; Housing   Sleep  Sleep:Sleep: Poor    Physical Exam: Physical Exam Vitals and nursing note reviewed.  Constitutional:      Appearance: Normal appearance. She is obese.  HENT:     Head: Normocephalic and atraumatic.  Pulmonary:     Effort: Pulmonary effort is normal.  Neurological:     General: No focal deficit present.     Mental Status: She is alert and oriented to person, place, and time.   Review of Systems  All other systems reviewed and are negative. Last menstrual period 02/18/2021. There is no height or weight on file to calculate BMI.   COGNITIVE FEATURES THAT CONTRIBUTE TO RISK:  Thought constriction (tunnel vision)    SUICIDE RISK:   Mild:  Suicidal ideation of limited frequency, intensity, duration, and specificity.  There are no identifiable plans, no associated intent, mild dysphoria and related symptoms, good self-control (both objective and subjective assessment), few other risk factors, and identifiable protective factors, including available and accessible social support.  PLAN OF CARE: Patient is seen and examined.  Patient is a 31 year old female with the above-stated past psychiatric history who was admitted with psychosis.  She will be admitted to the hospital.  She will be integrated in the milieu.  She will be encouraged to attend groups.   Given the very limited information that we have I am just going to go and restart her on Risperdal 1 mg p.o. twice daily.  She will also have trazodone available as needed as well as hydroxyzine.  We will have social work attempt to get in contact with her father, and will also have pharmacy contact [facility] to see if there is a more accurate list of medications.  I am hesitant to start the Abilify long-acting injectable at this point given what is in the notes about her previously having had side effects to it.  Review of her admission laboratories revealed essentially normal electrolytes with a mildly elevated glucose at 130.  Liver function enzymes were normal.  Creatinine was normal.  She does have an anemia with a hemoglobin of 10 and hematocrit of 35.8.  Her MCV, MCH and MCHC are all low.  Platelets were 334,000.  Differential was normal.  Acetaminophen was less than 10, salicylate less than 7.  Beta-hCG was negative.  Respiratory panel was negative for influenza A, B and coronavirus.  Blood alcohol was less than 10.  Drug screen was negative.  Her vital signs are stable, she is afebrile.  I certify that inpatient services furnished can reasonably be expected to improve the patient's condition.   Sharma Covert, MD 02/20/2021, 11:42 AM

## 2021-02-20 NOTE — Progress Notes (Signed)
Admission Note  Pt is a 30 yo female that presents voluntarily on 02/20/2021 with reported worsening anxiety and auditory hallucinations. Pt states they have never been admitted before and this is newly onset. Pt states they hear birds chirping and voices but can't make out what the voices are saying. Pt states their primary care told them to come to the hospital to possibly be started on abilify. Pt states they live with family but declined to allow for collateral. Pt denies alcohol/drug/tobacco/Rx use/abuse. Pt denies the use of medications at this time. Pt denies being Rx's anything. Pt denies past/present verbal/physical/sexual abuse. Pt denies self neglect. Pt denies past/present si/hi and verbally agrees to approach staff before harming self/others while at [facility] signed, handbook detailing the patient's rights, responsibilities, and visitor guidelines provided. Skin/belongings search completed and patient oriented to unit. Patient stable at this time. Patient given the opportunity to express concerns and ask questions. Patient given toiletries. Will continue to monitor.    [facility] Assessment 02/20/21:  Patient presented to the [facility] voluntarily and unaccompanied complaining of auditory hallucinations. Patient stated she first heard "voices" about a year ago and then, they went away. Patient stated they returned about a week ago. Patient stated she believes she needs to be admitted for inpatient psychiatric treatment. Patient did state that hearing the voices made her "want to give up" however, patient denied any suicidal thoughts or plans. Pt denied HI, VH and NSSH. Pt did seem to possibly be responding to internal stimuli at times. She sometimes looked off in the distance at nothing in particular and often paused as if listening before answering a question. When asked, patient denied hearing the voices during the assessment. Hallucinations were auditory hallucinations without a command per patient.  Patient stated that the voices "say mean stuff."   Father is primary support. She stated she lives with him. Her mother died in 10-04-20 and patient stated she still feels sad most days as a result. Patient was unable to answer questions regarding specific symptoms. She did state that she felt sad about her mother's death and felt "like giving up" due to the voices she hears. Patient stated she worries about the voices she hears. She did not elaborate.   Patient stated she is unemployed. Patient stated she sees [name] at [facility] weekly for outpatient therapy. Patient confirmed that she has been psychiatrically admitted 2 times, in 2017 and 2019. Patient stated in 2017 she had been diagnosed with schizophrenia. Patient stated she had been prescribed medications in 2019 but they had run out and she has not been taking them in quite a while. She could not specify which medications she was prescribed or by whom.

## 2021-02-20 NOTE — Progress Notes (Signed)
Pt stated she was doing better. Pt visible on the unit some this evening.     02/20/21 2200  Psych Admission Type (Psych Patients Only)  Admission Status Voluntary  Psychosocial Assessment  Patient Complaints Anxiety;Depression  Eye Contact Fair  Facial Expression Anxious;Sullen;Sad;Worried  Affect Anxious;Depressed;Sad;Sullen  Theatre stage manager  Appearance/Hygiene Body odor;In scrubs  Behavior Characteristics Cooperative;Anxious  Mood Depressed;Anxious  Thought Process  Coherency Blocking  Content Paranoia  Delusions Paranoid  Perception Hallucinations  Hallucination Auditory  Judgment Poor  Confusion None  Danger to Self  Current suicidal ideation? Denies  Danger to Others  Danger to Others None reported or observed

## 2021-02-20 NOTE — ED Notes (Signed)
TTS machine at bedside. 

## 2021-02-20 NOTE — ED Notes (Signed)
Attempted to call report at [facility] and was advised that the day shift nurse needed to call report

## 2021-02-20 NOTE — ED Provider Notes (Signed)
Emergency Medicine Provider Triage Evaluation Note  Ashley Vega , a 30 y.o. female  was evaluated in triage.  Pt complains of auditory hallucinations.  She states the voices tell her that she is nothing.  She denies SI or HI.  Denies any plan.  Denies recent illnesses.  Denies ETOH or drug use..  Review of Systems  Positive: Auditory hallucinations Negative: SI/HI  Physical Exam  There were no vitals taken for this visit. Gen:   Awake, no distress   Resp:  Normal effort  MSK:   Moves extremities without difficulty  Other:    Medical Decision Making  Medically screening exam initiated at 12:24 AM.  Appropriate orders placed.  Cj Favaro was informed that the remainder of the evaluation will be completed by another provider, this initial triage assessment does not replace that evaluation, and the importance of remaining in the ED until their evaluation is complete.  Patient here for help with auditory hallucinations.  No SI or HI.   Montine Circle, PA-C 02/20/21 Fredricka Bonine, April, MD 02/20/21 1735

## 2021-02-20 NOTE — Progress Notes (Signed)
Pt did not attend psycho-ed group. 

## 2021-02-20 NOTE — ED Provider Notes (Signed)
[facility] EMERGENCY DEPARTMENT Provider Note   CSN: [id number] Arrival date & time: 02/19/21  2238     History Chief Complaint  Patient presents with   Hallucinations    Ashley Vega is a 30 y.o. female.  Patient presents to the emergency department with a chief complaint of auditory hallucinations.  She states the voices have been telling her bad things about herself, but she denies any SI or HI.  She has never tried to harm herself before.  She does not have a plan.  She denies any recent illnesses.  Denies any drug or alcohol use.  Denies any other associated symptoms.  The history is provided by the patient. No language interpreter was used.      Past Medical History:  Diagnosis Date   Fibroids    No pertinent past medical history     Patient Active Problem List   Diagnosis Date Noted   Fibroids 02/06/2013   Heart murmur 02/06/2013   Irregular menses 02/06/2013    Past Surgical History:  Procedure Laterality Date   NO PAST SURGERIES       OB History     Gravida  1   Para      Term      Preterm      AB  1   Living  0      SAB  1   IAB      Ectopic      Multiple      Live Births              Family History  Problem Relation Age of Onset   Cancer Maternal Grandmother    Hypertension Maternal Grandfather    Kidney disease Maternal Grandfather    Other Neg Hx     Social History   Tobacco Use   Smoking status: Some Days    Pack years: 0.00    Types: Cigars   Smokeless tobacco: Never  Substance Use Topics   Alcohol use: No   Drug use: No    Home Medications Prior to Admission medications   Medication Sig Start Date End Date Taking? Authorizing Provider  naproxen (NAPROSYN) 500 MG tablet Take 1 tablet (500 mg total) by mouth 2 (two) times daily as needed for moderate pain. 09/15/20   Petrucelli, Samantha R, PA-C  budesonide (RHINOCORT AQUA) 32 MCG/ACT nasal spray Place 1 spray into both nostrils daily. Patient  not taking: Reported on 10/02/2017 08/11/16 09/15/20  Dalia Heading, PA-C    Allergies    Peanut-containing drug products  Review of Systems   Review of Systems  All other systems reviewed and are negative.  Physical Exam Updated Vital Signs Ht 5\' 6"  (1.676 m)   Wt 99.8 kg   LMP 02/18/2021   BMI 35.51 kg/m   Physical Exam Vitals and nursing note reviewed.  Constitutional:      General: She is not in acute distress.    Appearance: She is well-developed.  HENT:     Head: Normocephalic and atraumatic.  Eyes:     Conjunctiva/sclera: Conjunctivae normal.  Cardiovascular:     Rate and Rhythm: Normal rate.     Heart sounds: No murmur heard. Pulmonary:     Effort: Pulmonary effort is normal. No respiratory distress.  Abdominal:     General: There is no distension.  Musculoskeletal:     Cervical back: Neck supple.     Comments: Moves all extremities  Skin:    General:  Skin is warm and dry.  Neurological:     Mental Status: She is alert and oriented to person, place, and time.  Psychiatric:        Mood and Affect: Mood normal.        Behavior: Behavior normal.    ED Results / Procedures / Treatments   Labs (all labs ordered are listed, but only abnormal results are displayed) Labs Reviewed  RESP PANEL BY RT-PCR (FLU A&B, COVID) ARPGX2  COMPREHENSIVE METABOLIC PANEL  SALICYLATE LEVEL  ACETAMINOPHEN LEVEL  ETHANOL  RAPID URINE DRUG SCREEN, HOSP PERFORMED  CBC WITH DIFFERENTIAL/PLATELET  CBG MONITORING, ED  I-STAT BETA HCG BLOOD, ED ([facility], [facility], [facility] ONLY)    EKG None  Radiology No results found.  Procedures Procedures   Medications Ordered in ED Medications - No data to display  ED Course  I have reviewed the triage vital signs and the nursing notes.  Pertinent labs & imaging results that were available during my care of the patient were reviewed by me and considered in my medical decision making (see chart for details).    MDM Rules/Calculators/A&P                           Patient here for auditory hallucinations.  She is requesting to speak with a counselor.  Denies SI or HI.  Patient appears medically stable for TTS evaluation. Final Clinical Impression(s) / ED Diagnoses Final diagnoses:  Auditory hallucination    Rx / DC Orders ED Discharge Orders     None        Montine Circle, Hershal Coria 02/20/21 0033    Palumbo, April, MD 02/20/21 1856

## 2021-02-20 NOTE — ED Notes (Signed)
Pt placed in purple scrubs, belongings logged and secured in locker 8, pt wanded at this time

## 2021-02-20 NOTE — Progress Notes (Signed)
Pt did not attend orientation group.  

## 2021-02-21 LAB — FERRITIN: Ferritin: 8 ng/mL — ABNORMAL LOW (ref 11–307)

## 2021-02-21 LAB — FOLATE: Folate: 10.5 ng/mL (ref >5.9)

## 2021-02-21 LAB — IRON AND TIBC
Iron: 34 ug/dL (ref 28–170)
Saturation Ratios: 8 % — ABNORMAL LOW (ref 10.4–31.8)
TIBC: 406 ug/dL (ref 250–450)
UIBC: 372 ug/dL

## 2021-02-21 LAB — LIPID PANEL
Cholesterol: 151 mg/dL (ref 0–200)
HDL: 35 mg/dL — ABNORMAL LOW (ref >40)
LDL Cholesterol: 105 mg/dL — ABNORMAL HIGH (ref 0–99)
Total CHOL/HDL Ratio: 4.3 RATIO
Triglycerides: 55 mg/dL (ref <150)
VLDL: 11 mg/dL (ref 0–40)

## 2021-02-21 LAB — RETICULOCYTES
Immature Retic Fract: 32.1 % — ABNORMAL HIGH (ref 2.3–15.9)
RBC.: 4.94 MIL/uL (ref 3.87–5.11)
Retic Count, Absolute: 74.1 10*3/uL (ref 19.0–186.0)
Retic Ct Pct: 1.5 % (ref 0.4–3.1)

## 2021-02-21 LAB — TSH: TSH: 3.149 u[IU]/mL (ref 0.350–4.500)

## 2021-02-21 LAB — VITAMIN B12: Vitamin B-12: 235 pg/mL (ref 180–914)

## 2021-02-21 MED ORDER — FERROUS SULFATE 325 (65 FE) MG PO TABS
325.0000 mg | ORAL_TABLET | Freq: Every day | ORAL | Status: DC
  Administered 2021-02-22 – 2021-03-03 (×10): 325 mg via ORAL
  Filled 2021-02-21 (×12): qty 1

## 2021-02-21 MED ORDER — DOCUSATE SODIUM 100 MG PO CAPS
100.0000 mg | ORAL_CAPSULE | Freq: Every day | ORAL | Status: DC
  Administered 2021-02-21 – 2021-03-03 (×11): 100 mg via ORAL
  Filled 2021-02-21 (×13): qty 1

## 2021-02-21 MED ORDER — FERROUS SULFATE 325 (65 FE) MG PO TABS
325.0000 mg | ORAL_TABLET | Freq: Two times a day (BID) | ORAL | Status: DC
  Administered 2021-02-21: 325 mg via ORAL
  Filled 2021-02-21 (×4): qty 1

## 2021-02-21 NOTE — BHH Group Notes (Signed)
[facility] Group Notes:  (Nursing/MHT/Case Management/Adjunct)  Date:  02/21/2021  Time:  11:29 AM  Type of Therapy:  Group Therapy  Participation Level:  Active  Participation Quality:  Appropriate  Affect:  Appropriate  Cognitive:  Appropriate  Insight:  Appropriate  Engagement in Group:  Engaged  Modes of Intervention:  Orientation  Summary of Progress/Problems: Pt goal for today is to take the right approach with everything.   Ashley Vega J Keon Benscoter 02/21/2021, 11:29 AM
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
## 2021-02-21 NOTE — Progress Notes (Signed)
   02/21/21 0500  Sleep  Number of Hours 5

## 2021-02-21 NOTE — Progress Notes (Addendum)
Recreation Therapy Notes  Date: 6.21.22 Time: 1000 Location: 500 Hall Dayroom   Group Topic: Coping Skills   Goal Area(s) Addresses: Patient will define what a coping skill is. Patient will identify a positive coping skill for each letter of the alphabet. Patient will acknowledge benefit(s) of using learned coping skills post d/c.  Behavioral Response: Engaged   Intervention: Worksheet   Activity: Coping A to Z. Patient asked to identify what a coping skill is and when they use them. Patients with Probation officer discussed healthy versus unhealthy coping skills. Next patients were given a blank worksheet titled "Coping Skills A-Z". Patients were given 20 minutes to brainstorm before ideas were presented to the large group. Patients and LRT debriefed on the importance of coping skill selection based on situation and how coping skills are subjective.    Education: Radiographer, therapeutic, Environmental health practitioner, Discharge Planning.    Education Outcome: Acknowledges education/Verbalizes understanding/In group clarification offered   Clinical Observations/Feedback: Pt came to group late but completed the activity.  Pt was attentive to peers and was able to explain the coping skills identified.  Pt identified top 5 coping skills as ask for things, take a bath, clothing (neat, no wrinkles), driving (get away, have time to self) and entering (know the details before going into new situations).    Victorino Sparrow, LRT/CTRS     Ria Comment, Zorawar Strollo A 02/21/2021 11:40 AM

## 2021-02-21 NOTE — Progress Notes (Signed)
[facility] MD Progress Note  02/21/2021 10:21 AM Ashley Vega  MRN:  [id number] Subjective:  "I am doing okay today"   Objective: Ashley Vega is a 30 year old female with reported past psychiatric history significant for paranoid schizophrenia who originally presented to the [facility] emergency department on 02/19/2021 with auditory hallucinations.  The patient stated at that time the voices have been telling her bad things about her self.  She denied any suicidal or homicidal ideation.  She was seen by the comprehensive clinical assessment team and complained of auditory hallucinations there.  She stated that she had first heard the voices approximately a year ago, and then "they went away". She stated that they returned approximately a week ago. She stated that they told her that she should "give up".   Evaluation on the unit: Patient was seen and evaluated, chart reviewed and case discussed with the treatment team. Patient was sitting in the day room watching TV and was open to talking today. Patient stated she is "doing good because I got my medicine." She reported sleeping good last night. She reported her appetite is good. She is a poor historian with disorganized thoughts. and stated she has not been on her medications for 2 years but then stated "I have been getting it somehow though." She endorsed auditory hallucinations and stated it is a "sonic voice that says jock, stop, drop and roll, roll is really done and brother is the same." She stated the voices do not tell her to harm herself or anyone else and she is not frightened by them. She denies SI/HI and visual hallucinations. She does not appear to be paranoid and denies she feels as if people are out to get her or following her. She is pleasant on approach, calm and cooperative. She is compliant with her medications and has no issues with them. She stated her PCP told her to go get on some Abilify. In previous notes she said does not have a  PCP. She stated she has had adverse reaction to Abilify in the past so this medication will not be started until collateral can be obtained from family. CSW has been asked to try and get in touch with her father. She was hospitalized at [facility] in [place] in September 25, 2017 and discharged on Risperdal 1 mg PO BID, we restarted this medication. She also will have Trazodone PRN for sleep. She apparently her mother died in 09-25-2020.  Review of yesterday's labs shows anemia: H/H was low at 10/35.8. MCV low 72.6, MCH low 20.3, MCHC low 27.9 and RDW low at 8.8.  Review of today's labs: Anemia panel today shows Ferritin low at 8, Iron 34, UIBC 372, TIBC 406, Sat Ratios low at 8, and folate normal at 10.5 Vitamin B12 normal at 235. Will start Ferrous Sulfate 325 mg PO BID with meals.   Principal Problem: Schizophrenia ([place]) Diagnosis: Active Problems:   Schizophrenia ([place])  Total Time spent with patient:  25 minutes  Past Psychiatric History: See H&P  Past Medical History:  Past Medical History:  Diagnosis Date   Fibroids    No pertinent past medical history     Past Surgical History:  Procedure Laterality Date   NO PAST SURGERIES     Family History:  Family History  Problem Relation Age of Onset   Cancer Maternal Grandmother    Hypertension Maternal Grandfather    Kidney disease Maternal Grandfather    Other Neg Hx    Family Psychiatric  History: See H&P Social History:  Social History   Substance and Sexual Activity  Alcohol Use No     Social History   Substance and Sexual Activity  Drug Use No    Social History   Socioeconomic History   Marital status: Single    Spouse name: Not on file   Number of children: Not on file   Years of education: Not on file   Highest education level: Not on file  Occupational History   Not on file  Tobacco Use   Smoking status: Former    Pack years: 0.00    Types: Cigars   Smokeless tobacco: Never  Vaping Use   Vaping Use: Never used   Substance and Sexual Activity   Alcohol use: No   Drug use: No   Sexual activity: Yes    Birth control/protection: None  Other Topics Concern   Not on file  Social History Narrative   Not on file   Social Determinants of Health   Financial Resource Strain: Not on file  Food Insecurity: Not on file  Transportation Needs: Not on file  Physical Activity: Not on file  Stress: Not on file  Social Connections: Not on file   Additional Social History:      Sleep: Fair  Appetite:  Good  Current Medications: Current Facility-Administered Medications  Medication Dose Route Frequency Provider Last Rate Last Admin   acetaminophen (TYLENOL) tablet 650 mg  650 mg Oral Q6H PRN Sharma Covert, MD       alum & mag hydroxide-simeth (MAALOX/MYLANTA) 200-200-20 MG/5ML suspension 30 mL  30 mL Oral Q4H PRN Sharma Covert, MD       ferrous sulfate tablet 325 mg  325 mg Oral BID WC Ethelene Hal, NP       risperiDONE (RISPERDAL M-TABS) disintegrating tablet 2 mg  2 mg Oral Q8H PRN Sharma Covert, MD       And   LORazepam (ATIVAN) tablet 1 mg  1 mg Oral Q6H PRN Sharma Covert, MD       And   ziprasidone (GEODON) injection 20 mg  20 mg Intramuscular Q12H PRN Sharma Covert, MD       magnesium hydroxide (MILK OF MAGNESIA) suspension 30 mL  30 mL Oral Daily PRN Sharma Covert, MD       risperiDONE (RISPERDAL M-TABS) disintegrating tablet 1 mg  1 mg Oral BID Sharma Covert, MD   1 mg at 02/21/21 0746   traZODone (DESYREL) tablet 50 mg  50 mg Oral QHS PRN Sharma Covert, MD   50 mg at 02/20/21 2056    Lab Results:  Results for orders placed or performed during the hospital encounter of 02/20/21 (from the past 48 hour(s))  Vitamin B12     Status: None   Collection Time: 02/21/21  6:45 AM  Result Value Ref Range   Vitamin B-12 235 180 - 914 pg/mL    Comment: (NOTE) This assay is not validated for testing neonatal or myeloproliferative syndrome specimens  for Vitamin B12 levels. Performed at [facility], [place] [address], [place], [place] [address]   Ferritin     Status: Abnormal   Collection Time: 02/21/21  6:45 AM  Result Value Ref Range   Ferritin 8 (L) 11 - 307 ng/mL    Comment: Performed at [facility], [place] [address], [place], [place] [address]  Lipid panel     Status: Abnormal  Collection Time: 02/21/21  6:45 AM  Result Value Ref Range   Cholesterol 151 0 - 200 mg/dL   Triglycerides 55 <150 mg/dL   HDL 35 (L) >40 mg/dL   Total CHOL/HDL Ratio 4.3 RATIO   VLDL 11 0 - 40 mg/dL   LDL Cholesterol 105 (H) 0 - 99 mg/dL    Comment:        Total Cholesterol/HDL:CHD Risk Coronary Heart Disease Risk Table                     Men   Women  1/2 Average Risk   3.4   3.3  Average Risk       5.0   4.4  2 X Average Risk   9.6   7.1  3 X Average Risk  23.4   11.0        Use the calculated Patient Ratio above and the CHD Risk Table to determine the patient's CHD Risk.        ATP III CLASSIFICATION (LDL):  <100     mg/dL   Optimal  100-129  mg/dL   Near or Above                    Optimal  130-159  mg/dL   Borderline  160-189  mg/dL   High  >190     mg/dL   Very High Performed at [facility] [address], [place], [place] [address]   Reticulocytes     Status: Abnormal   Collection Time: 02/21/21  6:45 AM  Result Value Ref Range   Retic Ct Pct 1.5 0.4 - 3.1 %   RBC. 4.94 3.87 - 5.11 MIL/uL   Retic Count, Absolute 74.1 19.0 - 186.0 K/uL   Immature Retic Fract 32.1 (H) 2.3 - 15.9 %    Comment: Performed at [facility], [place] [address], [place], [place] [address]  TSH     Status: None   Collection Time: 02/21/21  6:45 AM  Result Value Ref Range   TSH 3.149 0.350 - 4.500 uIU/mL    Comment: Performed by a 3rd Generation assay with a functional sensitivity of <=0.01 uIU/mL. Performed at [facility], [place] [address] [place],   [address]     Blood Alcohol level:  Lab Results  Component Value Date   ETH <10 02/20/2021   ETH <10 83/41/9622    Metabolic Disorder Labs: No results found for: HGBA1C, MPG No results found for: PROLACTIN Lab Results  Component Value Date   CHOL 151 02/21/2021   TRIG 55 02/21/2021   HDL 35 (L) 02/21/2021   CHOLHDL 4.3 02/21/2021   VLDL 11 02/21/2021   LDLCALC 105 (H) 02/21/2021    Physical Findings: AIMS: Facial and Oral Movements Muscles of Facial Expression: None, normal Lips and Perioral Area: None, normal Jaw: None, normal Tongue: None, normal,Extremity Movements Upper (arms, wrists, hands, fingers): None, normal Lower (legs, knees, ankles, toes): None, normal, Trunk Movements Neck, shoulders, hips: None, normal, Overall Severity Severity of abnormal movements (highest score from questions above): None, normal Incapacitation due to abnormal movements: None, normal Patient's awareness of abnormal movements (rate only patient's report): No Awareness, Dental Status Current problems with teeth and/or dentures?: No Does patient usually wear dentures?: No  CIWA:    COWS:     Musculoskeletal: Strength & Muscle Tone: within normal limits Gait & Station: normal Patient leans: N/A  Psychiatric Specialty Exam:  Presentation  General Appearance: Fairly Groomed  Eye Contact:Fair  Speech:Normal Rate  Speech Volume:Decreased  Handedness:Right   Mood and Affect  Mood:Dysphoric  Affect:Congruent   Thought Process  Thought Processes:Goal Directed  Descriptions of Associations:Circumstantial  Orientation:Full (Time, Place and Person)  Thought Content:Delusions; Paranoid Ideation  History of Schizophrenia/Schizoaffective disorder:Yes  Duration of Psychotic Symptoms:Greater than six months  Hallucinations:Hallucinations: Auditory  Ideas of Reference:Delusions; Paranoia  Suicidal Thoughts:Suicidal Thoughts: No  Homicidal Thoughts:Homicidal Thoughts:  No   Sensorium  Memory:Immediate Poor; Recent Poor; Remote Poor  Judgment:Impaired  Insight:Lacking   Executive Functions  Concentration:Fair  Attention Span:Fair  Recall:Poor  Fund of Knowledge:Fair  Language:Fair   Psychomotor Activity  Psychomotor Activity:Psychomotor Activity: Increased   Assets  Assets:Desire for Improvement; Resilience; Social Support; Housing   Sleep  Sleep:Sleep: Poor  Physical Exam: Physical Exam Vitals and nursing note reviewed.  HENT:     Head: Normocephalic.  Pulmonary:     Effort: Pulmonary effort is normal.  Musculoskeletal:        General: Normal range of motion.     Cervical back: Normal range of motion.  Neurological:     General: No focal deficit present.     Mental Status: She is alert and oriented to person, place, and time.   Review of Systems  Constitutional: Negative.  Negative for fever.  HENT: Negative.  Negative for congestion, sinus pain and sore throat.   Respiratory: Negative.  Negative for cough and shortness of breath.   Cardiovascular: Negative.  Negative for chest pain.  Gastrointestinal: Negative.   Genitourinary: Negative.   Musculoskeletal: Negative.   Neurological: Negative.   Blood pressure 131/86, pulse 81, temperature 98.1 F (36.7 C), temperature source Oral, height 5\' 6"  (1.676 m), weight 99.8 kg, last menstrual period 02/18/2021, SpO2 100 %. Body mass index is 35.51 kg/m.   Treatment Plan Summary: Daily contact with patient to assess and evaluate symptoms and progress in treatment and Medication management  Schizophrenia:  -Continue Risperdal 1 mg PO BID   Insomnia:  -Continue Trazodone 50 mg PO at bedtime PRN  Anemia:-Initiate Fers Sulfate 325 mg PO BID  Agitation Protocol: -Continue Risperdal 2 mg PO q 8 hours PRN for agitation -Continue Ativan 1 mg PO every 6 hours PRN for anxiety -Continue Geodon 20 mg IM q 12 hours PRN for agitation  Continue every 15 minute safety  checks Encourage participation in the therapeutic milieu Discharge planning in progress - social work to assist with outpatient follow up and gathering collateral from family.    Ethelene Hal, NP 02/21/2021, 4:00 PM
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
## 2021-02-21 NOTE — Plan of Care (Signed)
  Problem: Education: Goal: Ability to state activities that reduce stress will improve Outcome: Progressing   Problem: Coping: Goal: Ability to identify and develop effective coping behavior will improve Outcome: Progressing   Problem: Self-Concept: Goal: Ability to identify factors that promote anxiety will improve Outcome: Progressing
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
## 2021-02-21 NOTE — Progress Notes (Signed)
Progress note    02/21/21 0746  Psych Admission Type (Psych Patients Only)  Admission Status Voluntary  Psychosocial Assessment  Patient Complaints Anxiety  Eye Contact Fair  Facial Expression Animated;Anxious  Affect Anxious;Appropriate to circumstance  Speech Logical/coherent  Interaction Assertive;Childlike  Motor Activity Slow  Appearance/Hygiene In scrubs  Behavior Characteristics Cooperative;Appropriate to situation;Anxious  Mood Anxious;Pleasant  Thought Process  Coherency WDL  Content WDL  Delusions None reported or observed  Hallucination None reported or observed  Judgment Poor  Confusion None  Danger to Self  Current suicidal ideation? Denies  Danger to Others  Danger to Others None reported or observed

## 2021-02-21 NOTE — Progress Notes (Signed)
Adult Psychoeducational Group Note  Date:  02/21/2021 Time:  8:34 PM  Group Topic/Focus:  Wrap-Up Group:   The focus of this group is to help patients review their daily goal of treatment and discuss progress on daily workbooks.  Participation Level:  Did Not Attend  Participation Quality:   Did Not Attend  Affect:  Did Not Attend  Cognitive:  Did Not Attend  Insight: None  Engagement in Group:  Did Not Attend  Modes of Intervention:  Did Not Attend  Additional Comments:  Pt did not attend evening wrap up group.  Candy Sledge 02/21/2021, 8:34 PM

## 2021-02-22 LAB — HEMOGLOBIN A1C

## 2021-02-22 MED ORDER — RISPERIDONE 1 MG PO TBDP
1.0000 mg | ORAL_TABLET | Freq: Every day | ORAL | Status: DC
  Administered 2021-02-23: 1 mg via ORAL
  Filled 2021-02-22 (×3): qty 1

## 2021-02-22 MED ORDER — RISPERIDONE 2 MG PO TBDP
2.0000 mg | ORAL_TABLET | Freq: Every day | ORAL | Status: DC
  Administered 2021-02-22 – 2021-02-23 (×2): 2 mg via ORAL
  Filled 2021-02-22 (×5): qty 1

## 2021-02-22 NOTE — Progress Notes (Signed)
Recreation Therapy Notes  Date:  6.22.22 Time: 1000 Location: 500 Hall Dayroom  Group Topic: Stress Management  Goal Area(s) Addresses:  Patient will identify positive stress management techniques. Patient will identify benefits of using stress management post d/c.  Intervention: Stress Management  Activity : Meditation.  LRT played a meditation that focused on having love and kindness towards the self and others.  Patients were to listen and follow along as meditation played to fully engage in activity.   Education:  Stress Management, Discharge Planning.   Education Outcome: Acknowledges Education  Clinical Observations/Feedback:  Pt did not attend group session.    Victorino Sparrow, LRT/CTRS         Victorino Sparrow A 02/22/2021 12:08 PM

## 2021-02-22 NOTE — Progress Notes (Addendum)
[facility] MD Progress Note  02/22/2021 3:14 PM Ashley Vega  MRN:  [id number] Subjective:  "I am working on these puzzles today"   Objective: Ashley Vega is a 30 year old female with reported past psychiatric history significant for paranoid schizophrenia who originally presented to the [facility] emergency department on 02/19/2021 with auditory hallucinations.  The patient stated at that time the voices have been telling her bad things about her self.  She denied any suicidal or homicidal ideation.  She was seen by the comprehensive clinical assessment team and complained of auditory hallucinations there.  She stated that she had first heard the voices approximately a year ago, and then "they went away". She stated that they returned approximately a week ago. She stated that they told her that she should "give up".   Evaluation on the unit: Patient was seen and evaluated, chart reviewed and case discussed with the treatment team. Patient was sitting in the day room doing word search puzzles. Patient stated she is "doing good because I got my medicine." She reported sleeping good last night, she slept 5.25 hours. She reported her appetite is good. She is a poor historian. Her thoughts continue to be  disorganized. Her affect is flat. She endorsed auditory hallucinations and stated they sound like "chirps" today. She denies SI/HI. She stated she sometimes sees birds fly in front of her eyes.  She is calm and cooperative. She is compliant with her medications and has no issues with them. She stated her PCP told her to go get on some Abilify.Previous notes indicate she has had an adverse reaction to Abilify in the past so this medication will not be started until collateral can be obtained from family. CSW has been asked to try and get in touch with her father. She was hospitalized at [facility] in [place] in 09/26/2017 and discharged on Risperdal 1 mg PO BID, restarted on admission. We will increase Risperdal  today to 1 mg daily and 2 mg at bedtime.She stated her mother passed away in 09-26-2022. She lives with her father, older brother and a younger sister. She asked me to call [facility] today because she needs glasses. She stated she has [employer], she does not. We will continue to monitor and provide support.   Principal Problem: Schizophrenia ([place]) Diagnosis: Principal Problem:   Schizophrenia ([place])  Total Time spent with patient:  25 minutes  Past Psychiatric History: See H&P  Past Medical History:  Past Medical History:  Diagnosis Date   Fibroids    No pertinent past medical history     Past Surgical History:  Procedure Laterality Date   NO PAST SURGERIES     Family History:  Family History  Problem Relation Age of Onset   Cancer Maternal Grandmother    Hypertension Maternal Grandfather    Kidney disease Maternal Grandfather    Other Neg Hx    Family Psychiatric  History: See H&P Social History:  Social History   Substance and Sexual Activity  Alcohol Use No     Social History   Substance and Sexual Activity  Drug Use No    Social History   Socioeconomic History   Marital status: Single    Spouse name: Not on file   Number of children: Not on file   Years of education: Not on file   Highest education level: Not on file  Occupational History   Not on file  Tobacco Use   Smoking status: Former  Pack years: 0.00    Types: Cigars   Smokeless tobacco: Never  Vaping Use   Vaping Use: Never used  Substance and Sexual Activity   Alcohol use: No   Drug use: No   Sexual activity: Yes    Birth control/protection: None  Other Topics Concern   Not on file  Social History Narrative   Not on file   Social Determinants of Health   Financial Resource Strain: Not on file  Food Insecurity: Not on file  Transportation Needs: Not on file  Physical Activity: Not on file  Stress: Not on file  Social Connections: Not on file   Additional Social History:       Sleep: Fair  Appetite:  Good  Current Medications: Current Facility-Administered Medications  Medication Dose Route Frequency Provider Last Rate Last Admin   acetaminophen (TYLENOL) tablet 650 mg  650 mg Oral Q6H PRN Sharma Covert, MD       alum & mag hydroxide-simeth (MAALOX/MYLANTA) 200-200-20 MG/5ML suspension 30 mL  30 mL Oral Q4H PRN Sharma Covert, MD       docusate sodium (COLACE) capsule 100 mg  100 mg Oral Daily Nelda Marseille, Carrigan Delafuente E, MD   100 mg at 02/22/21 9147   ferrous sulfate tablet 325 mg  325 mg Oral Q breakfast Nelda Marseille, Dixie Jafri E, MD   325 mg at 02/22/21 0930   risperiDONE (RISPERDAL M-TABS) disintegrating tablet 2 mg  2 mg Oral Q8H PRN Sharma Covert, MD   2 mg at 02/21/21 1624   And   LORazepam (ATIVAN) tablet 1 mg  1 mg Oral Q6H PRN Sharma Covert, MD   1 mg at 02/21/21 2040   And   ziprasidone (GEODON) injection 20 mg  20 mg Intramuscular Q12H PRN Sharma Covert, MD       magnesium hydroxide (MILK OF MAGNESIA) suspension 30 mL  30 mL Oral Daily PRN Sharma Covert, MD       [START ON 02/23/2021] risperiDONE (RISPERDAL M-TABS) disintegrating tablet 1 mg  1 mg Oral Daily Ethelene Hal, NP       risperiDONE (RISPERDAL M-TABS) disintegrating tablet 2 mg  2 mg Oral QHS Ethelene Hal, NP       traZODone (DESYREL) tablet 50 mg  50 mg Oral QHS PRN Sharma Covert, MD   50 mg at 02/21/21 2040    Lab Results:  Results for orders placed or performed during the hospital encounter of 02/20/21 (from the past 48 hour(s))  Vitamin B12     Status: None   Collection Time: 02/21/21  6:45 AM  Result Value Ref Range   Vitamin B-12 235 180 - 914 pg/mL    Comment: (NOTE) This assay is not validated for testing neonatal or myeloproliferative syndrome specimens for Vitamin B12 levels. Performed at [facility], [place] [address], [place], [place] [address]   Iron and TIBC     Status: Abnormal   Collection Time: 02/21/21  6:45 AM   Result Value Ref Range   Iron 34 28 - 170 ug/dL   TIBC 406 250 - 450 ug/dL   Saturation Ratios 8 (L) 10.4 - 31.8 %   UIBC 372 ug/dL    Comment: Performed at [facility], [place] [address] [place], [place] [address]  Ferritin     Status: Abnormal   Collection Time: 02/21/21  6:45 AM  Result Value Ref Range   Ferritin 8 (L) 11 - 307 ng/mL  Comment: Performed at [facility], [place] [address], [place], [place] [address]  Folate     Status: None   Collection Time: 02/21/21  6:45 AM  Result Value Ref Range   Folate 10.5 >5.9 ng/mL    Comment: Performed at [facility], [place] [address], [place], [place] [address]  Lipid panel     Status: Abnormal   Collection Time: 02/21/21  6:45 AM  Result Value Ref Range   Cholesterol 151 0 - 200 mg/dL   Triglycerides 55 <150 mg/dL   HDL 35 (L) >40 mg/dL   Total CHOL/HDL Ratio 4.3 RATIO   VLDL 11 0 - 40 mg/dL   LDL Cholesterol 105 (H) 0 - 99 mg/dL    Comment:        Total Cholesterol/HDL:CHD Risk Coronary Heart Disease Risk Table                     Men   Women  1/2 Average Risk   3.4   3.3  Average Risk       5.0   4.4  2 X Average Risk   9.6   7.1  3 X Average Risk  23.4   11.0        Use the calculated Patient Ratio above and the CHD Risk Table to determine the patient's CHD Risk.        ATP III CLASSIFICATION (LDL):  <100     mg/dL   Optimal  100-129  mg/dL   Near or Above                    Optimal  130-159  mg/dL   Borderline  160-189  mg/dL   High  >190     mg/dL   Very High Performed at [place] [address], [place], [place] [address]   Reticulocytes     Status: Abnormal   Collection Time: 02/21/21  6:45 AM  Result Value Ref Range   Retic Ct Pct 1.5 0.4 - 3.1 %   RBC. 4.94 3.87 - 5.11 MIL/uL   Retic Count, Absolute 74.1 19.0 - 186.0 K/uL   Immature Retic Fract 32.1 (H) 2.3 - 15.9 %    Comment: Performed at [facility], [place] [address], [place], [place] [address]  TSH     Status: None   Collection Time: 02/21/21  6:45 AM  Result Value Ref Range   TSH 3.149 0.350 - 4.500 uIU/mL    Comment: Performed by a 3rd Generation assay with a functional sensitivity of <=0.01 uIU/mL. Performed at [facility], [place] [address] [place], [place] [address]     Blood Alcohol level:  Lab Results  Component Value Date   ETH <10 02/20/2021   ETH <10 60/73/7106    Metabolic Disorder Labs: No results found for: HGBA1C, MPG No results found for: PROLACTIN Lab Results  Component Value Date   CHOL 151 02/21/2021   TRIG 55 02/21/2021   HDL 35 (L) 02/21/2021   CHOLHDL 4.3 02/21/2021   VLDL 11 02/21/2021   LDLCALC 105 (H) 02/21/2021    Physical Findings: AIMS: Facial and Oral Movements Muscles of Facial Expression: None, normal Lips and Perioral Area: None, normal Jaw: None, normal Tongue: None, normal,Extremity Movements Upper (arms, wrists, hands, fingers): None, normal Lower (legs, knees, ankles, toes): None, normal, Trunk Movements Neck, shoulders, hips: None, normal, Overall Severity Severity of abnormal movements (highest score  from questions above): None, normal Incapacitation due to abnormal movements: None, normal Patient's awareness of abnormal movements (rate only patient's report): No Awareness, Dental Status Current problems with teeth and/or dentures?: No Does patient usually wear dentures?: No  CIWA:    COWS:     Musculoskeletal: Strength & Muscle Tone: within normal limits Gait & Station: normal Patient leans: N/A  Psychiatric Specialty Exam:  Presentation  General Appearance: Fairly Groomed  Eye Contact:Fair  Speech:Normal Rate  Speech Volume:Decreased  Handedness:Right  Mood and Affect  Mood:Dysphoric  Affect:Congruent  Thought Process  Thought Processes:Goal Directed  Descriptions of Associations:Circumstantial  Orientation:Full (Time, Place and  Person)  Thought Content:Delusions; Paranoid Ideation  History of Schizophrenia/Schizoaffective disorder:Yes  Duration of Psychotic Symptoms:Greater than six months  Hallucinations:No data recorded  Ideas of Reference:Delusions; Paranoia  Suicidal Thoughts:No data recorded  Homicidal Thoughts:No data recorded  Sensorium  Memory:Immediate Poor; Recent Poor; Remote Poor  Judgment:Impaired  Insight:Lacking  Executive Functions  Concentration:Fair  Attention Span:Fair  Recall:Poor  Fund of Knowledge:Fair  Language:Fair  Psychomotor Activity  Psychomotor Activity:No data recorded  Assets  Assets:Desire for Improvement; Resilience; Social Support; Housing  Sleep  Sleep:No data recorded  Physical Exam: Physical Exam Vitals and nursing note reviewed.  Constitutional:      Appearance: Normal appearance.  HENT:     Head: Normocephalic.  Pulmonary:     Effort: Pulmonary effort is normal.  Musculoskeletal:        General: Normal range of motion.     Cervical back: Normal range of motion.  Neurological:     General: No focal deficit present.     Mental Status: She is alert and oriented to person, place, and time.   Review of Systems  Constitutional: Negative.  Negative for fever.  HENT: Negative.  Negative for congestion, sinus pain and sore throat.   Respiratory: Negative.  Negative for cough and shortness of breath.   Cardiovascular: Negative.  Negative for chest pain.  Gastrointestinal: Negative.   Genitourinary: Negative.   Musculoskeletal: Negative.   Neurological: Negative.   Blood pressure 131/86, pulse 81, temperature 98.1 F (36.7 C), temperature source Oral, height 5\' 6"  (1.676 m), weight 99.8 kg, last menstrual period 02/18/2021, SpO2 100 %. Body mass index is 35.51 kg/m.   Treatment Plan Summary: Daily contact with patient to assess and evaluate symptoms and progress in treatment and Medication management  Schizophrenia:  -Change Risperdal  to 1 mg PO daily and 2 mg PO at bedtime  Insomnia:  -Continue Trazodone 50 mg PO at bedtime PRN  Anemia: -Continue Ferous Sulfate 325 mg PO daily -Continue Colace 100 mg PO daily  Agitation Protocol: -Continue Risperdal 2 mg PO q 8 hours PRN for agitation -Continue Ativan 1 mg PO every 6 hours PRN for anxiety -Continue Geodon 20 mg IM q 12 hours PRN for agitation  Continue every 15 minute safety checks Encourage participation in the therapeutic milieu Discharge planning in progress - social work to assist with outpatient follow up and gathering collateral from family.    Ethelene Hal, NP 02/22/2021, 3:34 PM  I have reviewed the patient's chart and discussed the case with the APP. I agree with the assessment and plan of care as documented in the APP's note. Viann Fish, MD, [facility]

## 2021-02-22 NOTE — Progress Notes (Signed)
Pt presents calm, denies SI, HI, AVH and pain. Rates her anxiety 3/10, depression 6/10 and hopelessness 0/10. Per pt "I'm fine right now, I'm not hearing no voices today but yesterday the voices were constant.". Presented with flat affect but was pleasant. 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 Where have you been?" Pt was adamant about staff being her friend at her previous job even though the staff don't remember her. "Well if I tell y'all, you will not let me go". Pt also approached Probation officer earlier this afternoon "Hey Olive you know I lost my mom this year. I believe my mom's friend helped her to die" but again pt will not elaborate her claims further when prompted. Reported to be flirtatious and intrusive with female house keeping staff this evening; rubbed his left shoulder and right knee. Pt required  verbal redirections at the time and it was effective.  Verbal education done on appropriate methods of interactions with peers and staff in reference to respecting boundaries. Support and encouragement offered to pt. Q 15 minutes safety checks maintained without outburst or self harm gestures. All medications given as ordered and effects monitored.  Pt verbalized understanding. Pt did not attend group despite multiple prompts. Tolerated all medications and meals well without discomfort. Remains safety on and off unit.

## 2021-02-22 NOTE — Progress Notes (Addendum)
Pt disorganized at times, pt visible on the unit much of the evening.  Pt given PRN Trazodone per MAR with HS medication   02/22/21 2200  Psych Admission Type (Psych Patients Only)  Admission Status Voluntary  Psychosocial Assessment  Patient Complaints Worrying  Eye Contact Fair  Facial Expression Worried;Anxious  Affect Anxious;Appropriate to circumstance  Speech Logical/coherent  Interaction Assertive;Cautious;Superficial  Motor Activity Slow  Appearance/Hygiene In scrubs  Behavior Characteristics Cooperative  Mood Depressed  Thought Process  Coherency WDL  Content Blaming others ("My mom's friend helped her die but I don't want to talk about it")  Delusions Persecutory;Paranoid  Perception Hallucinations  Hallucination Auditory  Judgment Poor  Confusion None  Danger to Self  Current suicidal ideation? Denies  Danger to Others  Danger to Others None reported or observed

## 2021-02-22 NOTE — B[facility] Group Notes (Signed)
Type of Therapy and Topic:  Group Therapy:  Healthy and Unhealthy Supports   Participation Level:  Active    Description of Group:  Patients in this group were introduced to the idea of adding a variety of healthy supports to address the various needs in their lives. Patients discussed what additional healthy supports could be helpful in their recovery and wellness after discharge in order to prevent future hospitalizations.   An emphasis was placed on using counselor, doctor, therapy groups, 12-step groups, and problem-specific support groups to expand supports.  They also worked as a group on developing a specific plan for several patients to deal with unhealthy supports through boundary-setting, psychoeducation with loved ones, and even termination of relationships.   Therapeutic Goals:               1)  discuss importance of adding supports to stay well once out of the hospital             2)  compare healthy versus unhealthy supports and identify some examples of each             3)  generate ideas and descriptions of healthy supports that can be added             4)  offer mutual support about how to address unhealthy supports             5)  encourage active participation in and adherence to discharge plan               Summary of Patient Progress: Worksheets were provided and patient was given the opportunity to ask questions.    Therapeutic Modalities:   Motivational Interviewing Brief Solution-Focused Therapy 

## 2021-02-22 NOTE — Progress Notes (Signed)
   02/22/21 0700  Sleep  Number of Hours 5.25

## 2021-02-22 NOTE — Tx Team (Signed)
Interdisciplinary Treatment and Diagnostic Plan Update  02/22/2021 Time of Session: 10:00am  Ashley Vega MRN: [id number]  Principal Diagnosis: Schizophrenia [facility])  Secondary Diagnoses: Principal Problem:   Schizophrenia ([place])   Current Medications:  Current Facility-Administered Medications  Medication Dose Route Frequency Provider Last Rate Last Admin   acetaminophen (TYLENOL) tablet 650 mg  650 mg Oral Q6H PRN Sharma Covert, MD       alum & mag hydroxide-simeth (MAALOX/MYLANTA) 200-200-20 MG/5ML suspension 30 mL  30 mL Oral Q4H PRN Sharma Covert, MD       docusate sodium (COLACE) capsule 100 mg  100 mg Oral Daily Nelda Marseille, Amy E, MD   100 mg at 02/22/21 5643   ferrous sulfate tablet 325 mg  325 mg Oral Q breakfast Nelda Marseille, Amy E, MD       risperiDONE (RISPERDAL M-TABS) disintegrating tablet 2 mg  2 mg Oral Q8H PRN Sharma Covert, MD   2 mg at 02/21/21 1624   And   LORazepam (ATIVAN) tablet 1 mg  1 mg Oral Q6H PRN Sharma Covert, MD   1 mg at 02/21/21 2040   And   ziprasidone (GEODON) injection 20 mg  20 mg Intramuscular Q12H PRN Sharma Covert, MD       magnesium hydroxide (MILK OF MAGNESIA) suspension 30 mL  30 mL Oral Daily PRN Sharma Covert, MD       risperiDONE (RISPERDAL M-TABS) disintegrating tablet 1 mg  1 mg Oral BID Sharma Covert, MD   1 mg at 02/22/21 3295   traZODone (DESYREL) tablet 50 mg  50 mg Oral QHS PRN Sharma Covert, MD   50 mg at 02/21/21 2040   PTA Medications: Medications Prior to Admission  Medication Sig Dispense Refill Last Dose   acetaminophen (TYLENOL) 325 MG tablet Take 650 mg by mouth every 6 (six) hours as needed for mild pain or headache.      ibuprofen (ADVIL) 400 MG tablet Take 400 mg by mouth every 6 (six) hours as needed for mild pain or headache.      ABILIFY 10 MG tablet Take 10 mg by mouth daily.      RISPERDAL 1 MG tablet Take 1 mg by mouth at bedtime.       Patient Stressors: Health  problems Occupational concerns  Patient Strengths: Ability for insight Communication skills Physical Health Supportive family/friends  Treatment Modalities: Medication Management, Group therapy, Case management,  1 to 1 session with clinician, Psychoeducation, Recreational therapy.   Physician Treatment Plan for Primary Diagnosis: Schizophrenia ([place]) Long Term Goal(s): Improvement in symptoms so as ready for discharge   Short Term Goals: Ability to identify changes in lifestyle to reduce recurrence of condition will improve Ability to verbalize feelings will improve Ability to demonstrate self-control will improve Ability to identify and develop effective coping behaviors will improve Ability to maintain clinical measurements within normal limits will improve Compliance with prescribed medications will improve  Medication Management: Evaluate patient's response, side effects, and tolerance of medication regimen.  Therapeutic Interventions: 1 to 1 sessions, Unit Group sessions and Medication administration.  Evaluation of Outcomes: Not Met  Physician Treatment Plan for Secondary Diagnosis: Principal Problem:   Schizophrenia ([place])  Long Term Goal(s): Improvement in symptoms so as ready for discharge   Short Term Goals: Ability to identify changes in lifestyle to reduce recurrence of condition will improve Ability to verbalize feelings will improve Ability to demonstrate self-control will improve Ability to identify and develop effective  coping behaviors will improve Ability to maintain clinical measurements within normal limits will improve Compliance with prescribed medications will improve     Medication Management: Evaluate patient's response, side effects, and tolerance of medication regimen.  Therapeutic Interventions: 1 to 1 sessions, Unit Group sessions and Medication administration.  Evaluation of Outcomes: Not Met   RN Treatment Plan for Primary Diagnosis:  Schizophrenia ([place]) Long Term Goal(s): Knowledge of disease and therapeutic regimen to maintain health will improve  Short Term Goals: Ability to remain free from injury will improve, Ability to participate in decision making will improve, Ability to verbalize feelings will improve, Ability to disclose and discuss suicidal ideas, and Ability to identify and develop effective coping behaviors will improve  Medication Management: RN will administer medications as ordered by provider, will assess and evaluate patient's response and provide education to patient for prescribed medication. RN will report any adverse and/or side effects to prescribing provider.  Therapeutic Interventions: 1 on 1 counseling sessions, Psychoeducation, Medication administration, Evaluate responses to treatment, Monitor vital signs and CBGs as ordered, Perform/monitor CIWA, COWS, AIMS and Fall Risk screenings as ordered, Perform wound care treatments as ordered.  Evaluation of Outcomes: Not Met   LCSW Treatment Plan for Primary Diagnosis: Schizophrenia ([place]) Long Term Goal(s): Safe transition to appropriate next level of care at discharge, Engage patient in therapeutic group addressing interpersonal concerns.  Short Term Goals: Engage patient in aftercare planning with referrals and resources, Increase social support, Increase emotional regulation, Facilitate acceptance of mental health diagnosis and concerns, Identify triggers associated with mental health/substance abuse issues, and Increase skills for wellness and recovery  Therapeutic Interventions: Assess for all discharge needs, 1 to 1 time with Social worker, Explore available resources and support systems, Assess for adequacy in community support network, Educate family and significant other(s) on suicide prevention, Complete Psychosocial Assessment, Interpersonal group therapy.  Evaluation of Outcomes: Not Met   Progress in Treatment: Attending groups:  Yes. Participating in groups: Yes. Taking medication as prescribed: Yes. Toleration medication: Yes. Family/Significant other contact made: No, will contact:  [place]  Patient understands diagnosis: No. Discussing patient identified problems/goals with staff: Yes. Medical problems stabilized or resolved: Yes. Denies suicidal/homicidal ideation: Yes. Issues/concerns per patient self-inventory: No.   New problem(s) identified: No, Describe:  None   New Short Term/Long Term Goal(s): medication stabilization, elimination of SI thoughts, development of comprehensive mental wellness plan.   Patient Goals:    Discharge Plan or Barriers: Patient recently admitted. CSW will continue to follow and assess for appropriate referrals and possible discharge planning.   Reason for Continuation of Hospitalization: Hallucinations Medication stabilization Suicidal ideation  Estimated Length of Stay: 3 to 5 days   Attendees: Patient: Did not attend  02/22/2021   Physician: Viann Fish, MD  02/22/2021   Nursing:  02/22/2021   RN Care Manager: 02/22/2021   Social Worker: Verdis Frederickson, [place] 02/22/2021   Recreational Therapist:  02/22/2021   Other:  02/22/2021   Other:  02/22/2021   Other: 02/22/2021     Scribe for Treatment Team: Darleen Crocker, [place] 02/22/2021 11:08 AM

## 2021-02-23 DIAGNOSIS — F209 Schizophrenia, unspecified: Secondary | ICD-10-CM

## 2021-02-23 LAB — HEMOGLOBIN A1C
Hgb A1c MFr Bld: 6 % — ABNORMAL HIGH (ref 4.8–5.6)
Mean Plasma Glucose: 125.5 mg/dL

## 2021-02-23 MED ORDER — RISPERIDONE 1 MG PO TBDP
1.0000 mg | ORAL_TABLET | Freq: Once | ORAL | Status: AC
  Administered 2021-02-23: 1 mg via ORAL
  Filled 2021-02-23: qty 1

## 2021-02-23 MED ORDER — BENZTROPINE MESYLATE 0.5 MG PO TABS
0.5000 mg | ORAL_TABLET | Freq: Two times a day (BID) | ORAL | Status: DC | PRN
  Administered 2021-02-25: 0.5 mg via ORAL
  Filled 2021-02-23: qty 1

## 2021-02-23 MED ORDER — TRAZODONE HCL 100 MG PO TABS
100.0000 mg | ORAL_TABLET | Freq: Every evening | ORAL | Status: DC | PRN
  Administered 2021-02-23: 100 mg via ORAL
  Filled 2021-02-23: qty 1

## 2021-02-23 MED ORDER — RISPERIDONE 2 MG PO TBDP
2.0000 mg | ORAL_TABLET | Freq: Every day | ORAL | Status: DC
  Administered 2021-02-24 – 2021-02-28 (×5): 2 mg via ORAL
  Filled 2021-02-23 (×8): qty 1

## 2021-02-23 NOTE — Progress Notes (Signed)
Recreation Therapy Notes  Date: 6.23.22 Time: 1000 Location: 500 Hall Dayroom   Group Topic: Self-esteem  Goal Area(s) Addresses:  Patient will identify and write at least one positive trait about themself. Patient will acknowledge the benefit of healthy self-esteem. Patient will endorse understanding of ways to increase self-esteem.   Behavioral Response: Engaged   Intervention: Personalized Plate- printed license plate template, markers or colored pencils   Activity:  LRT began group session with open dialogue asking the patients to define self-esteem and  identify what influences self esteem.  Patients were then instructed to design a personalized license plate, with words and drawings, representing positive things about themselves. Pts were encouraged to include favorites, things they are proud of, what they enjoy doing, and dreams for their future. If a patient had a life motto or a meaningful phase that expressed their life values, pt's were asked to incorporate that into their design as well. Patients were given the opportunity to share their completed work with the group.  Education: LRT educated patients on the importance of healthy self-esteem and ways to build self-esteem. LRT addressed discharge planning reviewing positive coping skills and healthy support systems.  Education Outcome: Acknowledges education/In group clarification offered   Clinical Observations/Feedback: Pt identified self esteem as something that's in your heart.  Pt went on to express self esteem was influenced by "everything/people, weather, etc.".  Pt drew an 8 ball because 8 is pt favorite day of the week.  Pt believes in the phrase faith, loyalty and trust.   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  Pt also drew an orange to represent her favorite state of [place] and lady  bugs and honey bees.    Victorino Sparrow, LRT/CTRS    Victorino Sparrow A 02/23/2021 12:01 PM

## 2021-02-23 NOTE — Progress Notes (Addendum)
Pt slept for 3.25 hrs last night. Pt received PRN Trazodone 50 and scheduled Risperdal 2 HS. Pt denies SI/HI/AVH. Pt states "I don't want to talk to you, I need to talk to Doctor". Pt is asking and is preoccupied about discharge. Pt took a.m. meds without issue. Pt appears restless/suspicious/flat/guarded on approach. Pt remains safe on the unit.

## 2021-02-23 NOTE — [facility] Suicide Risk Assessment (Signed)
[facility] INPATIENT:  Family/Significant Other Suicide Prevention Education  Suicide Prevention Education:  Education Completed;  father, Heath Gold [phone]), has been identified by the patient as the family member/significant other with whom the patient will be residing, and identified as the person(s) who will aid the patient in the event of Ashley Vega mental health crisis (suicidal ideations/suicide attempt).  With written consent from the patient, the family member/significant other has been provided the following suicide prevention education, prior to the and/or following the discharge of the patient.    CSW spoke with pt's father, Heath Gold [phone]) who stated that they were unaware of where this patient was and stated they filed Ashley Vega missing persons report on her.   Pt's father has limited knowledge on pt's diagnosis and medications impact on her mental health. Pt's father stated he thought it was the medication that caused her to become slow and childlike.   This patient is able to return home to live with them at discharge.     The suicide prevention education provided includes the following: Suicide risk factors Suicide prevention and interventions National Suicide Hotline telephone number [facility] assessment telephone number [facility] Emergency Assistance [place] and/or Residential Mobile Crisis Unit telephone number  Request made of family/significant other to: Remove weapons (e.g., guns, rifles, knives), all items previously/currently identified as safety concern.   Remove drugs/medications (over-the-counter, prescriptions, illicit drugs), all items previously/currently identified as Ashley Vega safety concern.  The family member/significant other verbalizes understanding of the suicide prevention education information provided.  The family member/significant other agrees to remove the items of safety concern listed above.  Ashley Vega Ashley Vega  Ashley Vega 02/23/2021, 11:40 AM

## 2021-02-23 NOTE — Progress Notes (Signed)
[facility] MD Progress Note  02/23/2021 12:07 PM Ashley Vega  MRN:  [id number]  Subjective: Ashley Vega is a 30 y.o. female with a history of schizophrenia, who was initially admitted for inpatient psychiatric hospitalization on 02/20/2021 for management of increased AH. The patient is currently on Hospital Day 3.   Chart Review from last 24 hours:  The patient's chart was reviewed and nursing notes were reviewed. The patient's case was discussed in multidisciplinary team meeting. Per nursing, she was paranoid with staff this morning and yesterday was reportedly delusional that she worked with Comptroller. She has not had behavioral issues or acute safety concerns noted. Per [facility] she has been compliant with scheduled medications and has not required PRNs.  Information Obtained Today During Patient Interview: The patient was seen and evaluated on the unit. On assessment today the patient reports that her mood "is perfect." She denies SI, HI, paranoia, ideas of reference or VH but admits to residual AH that "are echoing what I say." She states the voices are not command in nature. She admits to belief in thought broadcasting today. She states she is moving her bowels without evidence of constipation and denies phyiscal complaints today. She denies noted medication side-effects. She states she did not sleep well last night and requests to increase her Trazodone. She reports good appetite.   Principal Problem: Schizophrenia ([place]) Diagnosis: Principal Problem:   Schizophrenia ([place])  Total Time Spent in Direct Patient Care:  I personally spent 30 minutes on the unit in direct patient care. The direct patient care time included face-to-face time with the patient, reviewing the patient's chart, communicating with other professionals, and coordinating care. Greater than 50% of this time was spent in counseling or coordinating care with the patient regarding goals of hospitalization, psycho-education, and  discharge planning needs.  Past Psychiatric History: see admission H&P  Past Medical History:  Past Medical History:  Diagnosis Date   Fibroids    No pertinent past medical history     Past Surgical History:  Procedure Laterality Date   NO PAST SURGERIES     Family History:  Family History  Problem Relation Age of Onset   Cancer Maternal Grandmother    Hypertension Maternal Grandfather    Kidney disease Maternal Grandfather    Other Neg Hx    Family Psychiatric  History: see admission H&P  Social History:  Social History   Substance and Sexual Activity  Alcohol Use No     Social History   Substance and Sexual Activity  Drug Use No    Social History   Socioeconomic History   Marital status: Single    Spouse name: Not on file   Number of children: Not on file   Years of education: Not on file   Highest education level: Not on file  Occupational History   Not on file  Tobacco Use   Smoking status: Former    Pack years: 0.00    Types: Cigars   Smokeless tobacco: Never  Vaping Use   Vaping Use: Never used  Substance and Sexual Activity   Alcohol use: No   Drug use: No   Sexual activity: Yes    Birth control/protection: None  Other Topics Concern   Not on file  Social History Narrative   Not on file   Social Determinants of Health   Financial Resource Strain: Not on file  Food Insecurity: Not on file  Transportation Needs: Not on file  Physical Activity: Not on  file  Stress: Not on file  Social Connections: Not on file   Sleep: Poor  Appetite:  Good  Current Medications: Current Facility-Administered Medications  Medication Dose Route Frequency Provider Last Rate Last Admin   acetaminophen (TYLENOL) tablet 650 mg  650 mg Oral Q6H PRN Sharma Covert, MD       alum & mag hydroxide-simeth (MAALOX/MYLANTA) 200-200-20 MG/5ML suspension 30 mL  30 mL Oral Q4H PRN Sharma Covert, MD       docusate sodium (COLACE) capsule 100 mg  100 mg Oral  Daily Nelda Marseille, Peggie Hornak E, MD   100 mg at 02/23/21 3220   ferrous sulfate tablet 325 mg  325 mg Oral Q breakfast Harlow Asa, MD   325 mg at 02/23/21 2542   risperiDONE (RISPERDAL M-TABS) disintegrating tablet 2 mg  2 mg Oral Q8H PRN Sharma Covert, MD   2 mg at 02/21/21 1624   And   LORazepam (ATIVAN) tablet 1 mg  1 mg Oral Q6H PRN Sharma Covert, MD   1 mg at 02/21/21 2040   And   ziprasidone (GEODON) injection 20 mg  20 mg Intramuscular Q12H PRN Sharma Covert, MD       magnesium hydroxide (MILK OF MAGNESIA) suspension 30 mL  30 mL Oral Daily PRN Sharma Covert, MD       risperiDONE (RISPERDAL M-TABS) disintegrating tablet 1 mg  1 mg Oral Daily Ethelene Hal, NP   1 mg at 02/23/21 7062   risperiDONE (RISPERDAL M-TABS) disintegrating tablet 2 mg  2 mg Oral QHS Ethelene Hal, NP   2 mg at 02/22/21 2037   traZODone (DESYREL) tablet 50 mg  50 mg Oral QHS PRN Sharma Covert, MD   50 mg at 02/22/21 2037    Lab Results: No results found for this or any previous visit (from the past 53 hour(s)).  Blood Alcohol level:  Lab Results  Component Value Date   ETH <10 02/20/2021   ETH <10 37/62/8315    Metabolic Disorder Labs: Lab Results  Component Value Date   HGBA1C QNSTST 02/21/2021   No results found for: PROLACTIN Lab Results  Component Value Date   CHOL 151 02/21/2021   TRIG 55 02/21/2021   HDL 35 (L) 02/21/2021   CHOLHDL 4.3 02/21/2021   VLDL 11 02/21/2021   LDLCALC 105 (H) 02/21/2021    Physical Findings: AIMS: Facial and Oral Movements Muscles of Facial Expression: None, normal Lips and Perioral Area: None, normal Jaw: None, normal Tongue: None, normal,Extremity Movements Upper (arms, wrists, hands, fingers): None, normal Lower (legs, knees, ankles, toes): None, normal, Trunk Movements Neck, shoulders, hips: None, normal, Overall Severity Severity of abnormal movements (highest score from questions above): None, normal Incapacitation  due to abnormal movements: None, normal Patient's awareness of abnormal movements (rate only patient's report): No Awareness, Dental Status Current problems with teeth and/or dentures?: No Does patient usually wear dentures?: No     Musculoskeletal: Strength & Muscle Tone: within normal limits Gait & Station: normal, steady Patient leans: N/A  Psychiatric Specialty Exam: Physical Exam Vitals reviewed.  HENT:     Head: Normocephalic.  Pulmonary:     Effort: Pulmonary effort is normal.  Neurological:     General: No focal deficit present.     Mental Status: She is alert.    Review of Systems  Respiratory:  Negative for shortness of breath.   Cardiovascular:  Negative for chest pain.  Gastrointestinal:  Negative for  constipation, diarrhea, nausea and vomiting.   Blood pressure 131/86, pulse 81, temperature 98.1 F (36.7 C), temperature source Oral, height 5\' 6"  (1.676 m), weight 99.8 kg, last menstrual period 02/18/2021, SpO2 100 %.Body mass index is 35.51 kg/m.  General Appearance:  fair hygiene, casually dressed  Eye Contact:  Good  Speech:  Clear and Coherent and Normal Rate  Volume:  Normal  Mood:   described as "perfect" - appears calm but aloof  Affect:   mildly guarded, calm, polite  Thought Process:  Goal Directed but concrete  Orientation:  Other:  Oriented to self and month but not year, city or Scientist, clinical (histocompatibility and immunogenetics) Content:   Endorses belief in thought broadcasting and reports AH; denies VH thought insertion/withdrawal or ideas of reference; appears paranoid on exam  Suicidal Thoughts:  No  Homicidal Thoughts:  No  Memory:  Recent;   Fair  Judgement:  Fair  Insight:  Lacking  Psychomotor Activity:  Normal  Concentration:  Concentration: Fair and Attention Span: Fair  Recall:  [employer] of Knowledge:  Fair  Language:  Good  Akathisia:  Negative  Assets:  Communication Skills Desire for Improvement Resilience Social Support  ADL's:  Intact  Cognition:  WNL   Sleep:  Number of Hours: 3.25   Treatment Plan Summary: Diagnoses / Active Problems: Schizophrenia by hx  PLAN: Safety and Monitoring:  -- Voluntary admission to inpatient psychiatric unit for safety, stabilization and treatment  -- Daily contact with patient to assess and evaluate symptoms and progress in treatment  -- Patient's case to be discussed in multi-disciplinary team meeting  -- Observation Level : q15 minute checks  -- Vital signs:  q12 hours  -- Precautions: suicide  2. Psychiatric Diagnoses and Treatment:   Schizophrenia by hx  -- Increase Risperdal to 2mg  po bid (giving an additional 1mg  dose now for today) to treat residual paranoia and AH  -- Order Cogentin 0.5mg  bid PRN EPS  -- Continue Risperdal agitation protocol PRN  -- Increase Trazodone to 100mg  po qhs PRN insomnia  -- Metabolic profile and EKG monitoring obtained while on an atypical antipsychotic (Lipid Panel: cholesterol 151, Triglycerides 55, HDL 35, LDL 105; HbgA1c: pending; QTc:465ms)   -- Encouraged patient to participate in unit milieu and in scheduled group therapies   -- Short Term Goals: Ability to demonstrate self-control will improve and Ability to identify and develop effective coping behaviors will improve  -- Long Term Goals: Improvement in symptoms so as ready for discharge   3. Medical Issues Being Addressed:   Anemia with low Ferritin  -- Continue Ferrous Sulfate 325mg  daily with Colace 100mg  daily  4. Discharge Planning:   -- Social work and case management to assist with discharge planning and identification of hospital follow-up needs prior to discharge  -- Estimated LOS: 3-4 days  -- Discharge Concerns: Need to establish a safety plan; Medication compliance and effectiveness  -- Discharge Goals: Return home with outpatient referrals for mental health follow-up including medication management/psychotherapy  Harlow Asa, MD, FAPA 02/23/2021, 12:07 PM

## 2021-02-23 NOTE — B[facility] Group Notes (Signed)
Adult Psychoeducational Group Note  Date:  02/23/2021 Time:  8:17 PM  Group Topic/Focus:  Goals Group:   The focus of this group is to help patients establish daily goals to achieve during treatment and discuss how the patient can incorporate goal setting into their daily lives to aide in recovery. Managing Feelings:   The focus of this group is to identify what feelings patients have difficulty handling and develop a plan to handle them in a healthier way upon discharge.  Participation Level:  Active  Participation Quality:  Appropriate  Affect:  Appropriate  Cognitive:  Alert  Insight: Improving  Engagement in Group:  Off Topic  Modes of Intervention:  Discussion  Additional Comments  Dalene Carrow 02/23/2021, 8:17 PM

## 2021-02-23 NOTE — B[facility] Group Notes (Signed)
Occupational Therapy Group Note Date: 02/23/2021 Group Topic/Focus: Feelings Management  Group Description: Group encouraged increased participation and engagement through discussion focused on STRENGTHS. Patients were encouraged to fill out a worksheet to structure discussion, that included questions identifying strengths within relationships, school/profession, and personal fulfillment/leisure. Discussion followed with patients sharing their responses and highlighting their own personal strengths.   Therapeutic Goals: Identify strengths vs weaknesses Discuss and identify ways we can highlight our strengths Participation Level: Patient did not attend OT group session despite personal invitation. Pt came into group room to get coffee, however declined to stay, stating she was tired and wanted to lay down.    Plan: Continue to engage patient in OT groups 2 - 3x/week.  02/23/2021  Ponciano Ort, MOT, OTR/L

## 2021-02-23 NOTE — Progress Notes (Signed)
Pt did not attend orientation group.  

## 2021-02-23 NOTE — B[facility] Group Notes (Signed)
[place] Group Notes:  (Nursing/MHT/Case Management/Adjunct)  Date:  02/23/2021  Time:  4:00 PM  Type of Therapy:  Group Therapy  Participation Level:  Did Not Attend  Participation Quality:     Affect:    Cognitive:    Insight:    Engagement in Group:    Modes of Intervention:  Education  Summary of Progress/Problems:  Did not attend group despite staff invitation.    Barbette Or Dymin Dingledine 02/23/2021, 5:29 PM

## 2021-02-24 MED ORDER — TRAZODONE HCL 100 MG PO TABS
200.0000 mg | ORAL_TABLET | Freq: Every day | ORAL | Status: DC
  Administered 2021-02-24 – 2021-02-27 (×4): 200 mg via ORAL
  Filled 2021-02-24 (×8): qty 2

## 2021-02-24 MED ORDER — TRAZODONE HCL 100 MG PO TABS
200.0000 mg | ORAL_TABLET | Freq: Every evening | ORAL | Status: DC | PRN
Start: 2021-02-24 — End: 2021-02-24

## 2021-02-24 MED ORDER — MELATONIN 3 MG PO TABS
6.0000 mg | ORAL_TABLET | Freq: Every day | ORAL | Status: DC
  Administered 2021-02-24 – 2021-02-26 (×3): 6 mg via ORAL
  Filled 2021-02-24 (×7): qty 2

## 2021-02-24 MED ORDER — RISPERIDONE 2 MG PO TBDP
3.0000 mg | ORAL_TABLET | Freq: Every day | ORAL | Status: DC
  Administered 2021-02-24: 3 mg via ORAL
  Filled 2021-02-24 (×4): qty 1

## 2021-02-24 NOTE — Plan of Care (Signed)
  Problem: Self-Concept: Goal: Level of anxiety will decrease Outcome: Progressing Goal: Ability to modify response to factors that promote anxiety will improve Outcome: Progressing   Problem: Education: Goal: Knowledge of [place] [facility] Education information/materials will improve Outcome: Progressing

## 2021-02-24 NOTE — B[facility] Group Notes (Signed)
SPIRITUALITY GROUP NOTE   Spirituality group facilitated by Ashley Vega, [place].   Group Description: Group focused on topic of hope. Patients participated in facilitated discussion around topic, connecting with one another around experiences and definitions for hope. Group members engaged with visual explorer photos, reflecting on what hope looks like for them today. Group engaged in discussion around how their definitions of hope are present today in hospital.   Modalities: Psycho-social ed, Adlerian, Narrative, MI   Patient Progress: Ashley Vega engaged in group conversation and was able to identify some hope and healing.  At times she nodded off, but at other times she was an attentive listener.  Ashley Vega, [place] Pager, [phone] 6:13 PM

## 2021-02-24 NOTE — BHH Group Notes (Signed)
[place] LCSW Group Therapy  02/24/2021 11:53 AM  Type of Therapy:  Group Therapy  Participation Level:  Active  Participation Quality:  Appropriate  Affect:  Appropriate  Cognitive:  Appropriate  Insight:  Developing/Improving  Engagement in Therapy:  Distracting and Engaged  Modes of Intervention:  Discussion, Education, and Problem-solving  Summary of Progress/Problems: Patient engaged well in group. Group discussion was around sleep hygeine. Patient discussed several different things she does in order to sleep well including staying away from electronics, changing bedroom to a comfortable temperature and thinking positive thoughts before bed. Patient discussed that eating or drinking caffeine can make it hard to sleep. Patient discussed that having a sleep schedule can improve ability to fall asleep and having times that she does not eat/drink caffeine before bed.    Kham Zuckerman E [place] E[place] 02/24/2021, 11:53 AM

## 2021-02-24 NOTE — Progress Notes (Signed)
Pt complaining of agitation and hearing voices. Pt provided medication. Will continue to monitor

## 2021-02-24 NOTE — Progress Notes (Signed)
Progress note  Pt found in bed; compliant with medication administration. Pt still seems disorganized and lacks insight into their situation. Pt continues to have poor hygiene and a cluttered room. Pt can be needy and manipulative with staff assistance at times. Pt denies si/hi/ah/vh and verbally agrees to approach staff if these become apparent or before harming themselves/others while at [place].  A: Pt provided support and encouragement. Pt given medication per protocol and standing orders. Q52m safety checks implemented and continued.  R: Pt safe on the unit. Will continue to monitor.

## 2021-02-24 NOTE — Progress Notes (Signed)
Adult Psychoeducational Group Note  Date:  02/24/2021 Time:  11:55 PM  Group Topic/Focus:  Wrap-Up Group:   The focus of this group is to help patients review their daily goal of treatment and discuss progress on daily workbooks.  Participation Level:  Active  Participation Quality:  Appropriate  Affect:  Anxious and Irritable  Cognitive:  Disorganized and Confused  Insight: Limited  Engagement in Group:  Limited  Modes of Intervention:  Discussion  Additional Comments:  Pt stated her goal for today was to focus on her treatment plan. Pt stated she accomplished her goal today. Pt stated she talked with her doctor and her social worker about her care today. Pt rated her overall day a 10. Pt stated she was able to contact her father and family friend today which improved her overall day. Pt stated she felt better about herself tonight. Pt stated she was able to attend all groups held today. Pt stated she was able to attend all meals. Pt stated she took all medications provided today. Pt stated her appetite was pretty good today. Pt rated her sleep last night was pretty good. Pt stated the goal tonight was to get some rest. Pt stated she had no physical pain today. Pt deny visual hallucinations and auditory issues tonight. Pt denies thoughts of harming herself or others. Pt stated she would alert staff if anything changed.  Candy Sledge 02/24/2021, 11:55 PM

## 2021-02-24 NOTE — Progress Notes (Signed)
Recreation Therapy Notes  Date: 6.24.22 Time: 1000 Location: 500 Hall Dayroom   Group Topic: Leisure Education, Leisure Exposure    Goal Area(s) Addresses:  Patient will successfully identify skills used t complete activity. Patient will successfully identify how skills could be used with support system upon d/c.  Behavioral Response: Minimal  Intervention: Recreation Participation, Speed, Music   Activity: Keep It Chartered certified accountant.  LRT introduced the activity and gave patients the instructions for activity.  LRT counted the number of hits patients got on the ball without letting the ball come to a complete stop.  Patients could bounce the ball or let it roll as long as it didn't stop.  If ball came to a stop, LRT would start the count over.  Education:  Publishing copy, Clinical research associate, Stress Management, Discharge Planning    Education Outcome: Acknowledges Education  Clinical Observations/Feedback: Pt was aloof and in her own world for most of the group.  Pt had a hard time concentrating on the activity and needed lots of redirection.  Pt did not participate in processing.       Victorino Sparrow, LRT/CTRS     Victorino Sparrow A 02/24/2021 11:33 AM

## 2021-02-24 NOTE — Progress Notes (Signed)
[facility] MD Progress Note  02/24/2021 6:57 PM Ashley Vega  MRN:  [id number]  Subjective: Ashley Vega is a 30 y.o. female with a history of schizophrenia, who was initially admitted for inpatient psychiatric hospitalization on 02/20/2021 for management of increased AH. The patient is currently on Hospital Day 4.   Chart Review from last 24 hours:  The patient's chart was reviewed and nursing notes were reviewed. The patient's case was discussed in multidisciplinary team meeting. Per nursing, she has had disorganized thinking but was able to attend some groups. She has had no behavioral issues noted. Per MAR she has been compliant with scheduled medications and received Trazodone for sleep and Ativan po X1 PRN for residual insomnia/agitation last night.  Information Obtained Today During Patient Interview: The patient was seen and evaluated on the unit. She states she did not sleep well last night but reports good appetite. She was prompted to take a shower and agrees to do this today. She denies paranoia, AH, first rank symptoms, or ideas of reference and denies SI or HI. She admits to some residual VH of "seeing objects that are not there or seeing things turn into something else" but cannot elaborate further. She denies medication side-effects and voices no physical complaints.   Principal Problem: Schizophrenia ([place]) Diagnosis: Principal Problem:   Schizophrenia ([place])  Total Time Spent in Direct Patient Care:  I personally spent 29 minutes on the unit in direct patient care. The direct patient care time included face-to-face time with the patient, reviewing the patient's chart, communicating with other professionals, and coordinating care. Greater than 50% of this time was spent in counseling or coordinating care with the patient regarding goals of hospitalization, psycho-education, and discharge planning needs.  Past Psychiatric History: see admission H&P  Past Medical History:  Past Medical History:   Diagnosis Date  . Fibroids   . No pertinent past medical history     Past Surgical History:  Procedure Laterality Date  . NO PAST SURGERIES     Family History:  Family History  Problem Relation Age of Onset  . Cancer Maternal Grandmother   . Hypertension Maternal Grandfather   . Kidney disease Maternal Grandfather   . Other Neg Hx    Family Psychiatric  History: see admission H&P  Social History:  Social History   Substance and Sexual Activity  Alcohol Use No     Social History   Substance and Sexual Activity  Drug Use No    Social History   Socioeconomic History  . Marital status: Single    Spouse name: Not on file  . Number of children: Not on file  . Years of education: Not on file  . Highest education level: Not on file  Occupational History  . Not on file  Tobacco Use  . Smoking status: Former    Pack years: 0.00    Types: Cigars  . Smokeless tobacco: Never  Vaping Use  . Vaping Use: Never used  Substance and Sexual Activity  . Alcohol use: No  . Drug use: No  . Sexual activity: Yes    Birth control/protection: None  Other Topics Concern  . Not on file  Social History Narrative  . Not on file   Social Determinants of Health   Financial Resource Strain: Not on file  Food Insecurity: Not on file  Transportation Needs: Not on file  Physical Activity: Not on file  Stress: Not on file  Social Connections: Not on file   Sleep:  Poor  Appetite:  Good  Current Medications: Current Facility-Administered Medications  Medication Dose Route Frequency Provider Last Rate Last Admin  . acetaminophen (TYLENOL) tablet 650 mg  650 mg Oral Q6H PRN Sharma Covert, MD      . alum & mag hydroxide-simeth (MAALOX/MYLANTA) 200-200-20 MG/5ML suspension 30 mL  30 mL Oral Q4H PRN Sharma Covert, MD      . benztropine (COGENTIN) tablet 0.5 mg  0.5 mg Oral BID PRN Nelda Marseille, Hayato Guaman E, MD      . docusate sodium (COLACE) capsule 100 mg  100 mg Oral Daily  Nelda Marseille, Montoya Watkin E, MD   100 mg at 02/24/21 0729  . ferrous sulfate tablet 325 mg  325 mg Oral Q breakfast Harlow Asa, MD   325 mg at 02/24/21 0729  . risperiDONE (RISPERDAL M-TABS) disintegrating tablet 2 mg  2 mg Oral Q8H PRN Sharma Covert, MD   2 mg at 02/24/21 1652   And  . LORazepam (ATIVAN) tablet 1 mg  1 mg Oral Q6H PRN Sharma Covert, MD   1 mg at 02/23/21 2352   And  . ziprasidone (GEODON) injection 20 mg  20 mg Intramuscular Q12H PRN Sharma Covert, MD      . magnesium hydroxide (MILK OF MAGNESIA) suspension 30 mL  30 mL Oral Daily PRN Sharma Covert, MD      . risperiDONE (RISPERDAL M-TABS) disintegrating tablet 2 mg  2 mg Oral QHS Ethelene Hal, NP   2 mg at 02/23/21 2047  . risperiDONE (RISPERDAL M-TABS) disintegrating tablet 2 mg  2 mg Oral Daily Viann Fish E, MD   2 mg at 02/24/21 0729  . traZODone (DESYREL) tablet 100 mg  100 mg Oral QHS PRN Harlow Asa, MD   100 mg at 02/23/21 2047    Lab Results:  Results for orders placed or performed during the hospital encounter of 02/20/21 (from the past 48 hour(s))  Hemoglobin A1c     Status: Abnormal   Collection Time: 02/23/21  6:22 PM  Result Value Ref Range   Hgb A1c MFr Bld 6.0 (H) 4.8 - 5.6 %    Comment: (NOTE) Pre diabetes:          5.7%-6.4%  Diabetes:              >6.4%  Glycemic control for   <7.0% adults with diabetes    Mean Plasma Glucose 125.5 mg/dL    Comment: Performed at [facility], [place] [address], [place], [place] [address]    Blood Alcohol level:  Lab Results  Component Value Date   [facility] <10 02/20/2021   ETH <10 26/94/8546    Metabolic Disorder Labs: Lab Results  Component Value Date   HGBA1C 6.0 (H) 02/23/2021   MPG 125.5 02/23/2021   No results found for: PROLACTIN Lab Results  Component Value Date   CHOL 151 02/21/2021   TRIG 55 02/21/2021   HDL 35 (L) 02/21/2021   CHOLHDL 4.3 02/21/2021   VLDL 11 02/21/2021   LDLCALC 105 (H) 02/21/2021     Physical Findings: AIMS: Facial and Oral Movements Muscles of Facial Expression: None, normal Lips and Perioral Area: None, normal Jaw: None, normal Tongue: None, normal,Extremity Movements Upper (arms, wrists, hands, fingers): None, normal Lower (legs, knees, ankles, toes): None, normal, Trunk Movements Neck, shoulders, hips: None, normal, Overall Severity Severity of abnormal movements (highest score from questions above): None, normal Incapacitation due to abnormal movements: None, normal  Patient's awareness of abnormal movements (rate only patient's report): No Awareness, Dental Status Current problems with teeth and/or dentures?: No Does patient usually wear dentures?: No     Musculoskeletal: Strength & Muscle Tone: within normal limits Gait & Station: normal, steady Patient leans: N/A  Psychiatric Specialty Exam: Physical Exam Vitals reviewed.  HENT:     Head: Normocephalic.  Pulmonary:     Effort: Pulmonary effort is normal.  Neurological:     General: No focal deficit present.     Mental Status: She is alert.    Review of Systems  Respiratory:  Negative for shortness of breath.   Cardiovascular:  Negative for chest pain.  Gastrointestinal:  Negative for constipation, diarrhea, nausea and vomiting.   Blood pressure 140/68, pulse 100, temperature 97.7 F (36.5 C), temperature source Oral, height 5\' 6"  (1.676 m), weight 99.8 kg, last menstrual period 02/18/2021, SpO2 100 %.Body mass index is 35.51 kg/m.  General Appearance:  more disheveled today, casually dressed  Eye Contact:  Good  Speech:  Clear and Coherent and Normal Rate  Volume:  Normal  Mood:   aloof  Affect:   mildly guarded, calm, polite  Thought Process:  Goal Directed but concrete  Orientation:  oriented to self and place and time  Thought Content:  Endorses VH, but denies AH, ideas of reference, or first rank symptoms - guarded on exam  Suicidal Thoughts:  No  Homicidal Thoughts:  No  Memory:   Recent;   Fair  Judgement:  Fair  Insight:  Lacking  Psychomotor Activity:  Normal  Concentration:  Concentration: Fair and Attention Span: Fair  Recall:  [employer] of Knowledge:  Fair  Language:  Good  Akathisia:  Negative  Assets:  Communication Skills Desire for Improvement Resilience Social Support  ADL's:  Intact  Cognition:  WNL  Sleep:  Number of Hours: 2.75   Treatment Plan Summary: Diagnoses / Active Problems: Schizophrenia by hx  PLAN: Safety and Monitoring:  -- Voluntary admission to inpatient psychiatric unit for safety, stabilization and treatment  -- Daily contact with patient to assess and evaluate symptoms and progress in treatment  -- Patient's case to be discussed in multi-disciplinary team meeting  -- Observation Level : q15 minute checks  -- Vital signs:  q12 hours  -- Precautions: suicide  2. Psychiatric Diagnoses and Treatment:   Schizophrenia by hx  -- Increase Risperdal to 2mg  qam and 3mg  po qhs   -- Continue Cogentin 0.5mg  bid PRN EPS  -- Continue Risperdal agitation protocol PRN  -- Increase Trazodone to 200mg  po qhs scheduled with Melatonin 6mg  po qhs  -- Metabolic profile and EKG monitoring obtained while on an atypical antipsychotic (Lipid Panel: cholesterol 151, Triglycerides 55, HDL 35, LDL 105; HbgA1c: 6.0; QTc:429ms)   -- Encouraged patient to participate in unit milieu and in scheduled group therapies   -- Short Term Goals: Ability to demonstrate self-control will improve and Ability to identify and develop effective coping behaviors will improve  -- Long Term Goals: Improvement in symptoms so as ready for discharge   3. Medical Issues Being Addressed:   Anemia with low Ferritin  -- Continue Ferrous Sulfate 325mg  daily with Colace 100mg  daily  4. Discharge Planning:   -- Social work and case management to assist with discharge planning and identification of hospital follow-up needs prior to discharge  -- Estimated LOS: 4-5 days  --  Discharge Concerns: Need to establish a safety plan; Medication compliance and effectiveness  -- Discharge  Goals: Return home with outpatient referrals for mental health follow-up including medication management/psychotherapy  Harlow Asa, MD, FAPA 02/24/2021, 6:57 PM

## 2021-02-24 NOTE — Progress Notes (Signed)
Pt paranoid on the unit, but pt a little talkative . Pt bizarre at times and disorganized, but pleasant. Pt parents came to [facility] expecting to take pt home. Writer explained to pt that she would be notified when she will be D/C and she still needed to authorize [place] to talk to her family on her visitation / telephone consent sheet. Pt was asked to sign it yesterday, but pt refused and was offered tonight but pt walked away.      02/24/21 2100  Psych Admission Type (Psych Patients Only)  Admission Status Voluntary  Psychosocial Assessment  Patient Complaints Anxiety;Worrying  Eye Contact Fair  Facial Expression Animated;Anxious;Worried  Affect Anxious;Preoccupied  Associate Professor Assertive;Needy  Motor Activity Slow  Appearance/Hygiene Body odor;Disheveled;Poor hygiene;In scrubs  Behavior Characteristics Cooperative  Mood Anxious;Preoccupied  Thought Process  Coherency Concrete thinking  Content Blaming others  Delusions Persecutory  Perception Hallucinations  Hallucination Auditory  Judgment Poor  Confusion None  Danger to Self  Current suicidal ideation? Denies  Danger to Others  Danger to Others None reported or observed

## 2021-02-24 NOTE — Progress Notes (Signed)
Pt paranoid on the unit, pt suspicious at times and appears disorganized some this evening. Pt makes bizarre statements at times" I swallowed the pills and I know you said let it dissolve under my tongue, I'm sorry I just wanted you to know" Pt was given PRN Trazodone per [facility] with HS medication. Pt was anxious and having issue sleeping , so pt was given PRN Ativan per [facility] also.     02/24/21 0000  Psych Admission Type (Psych Patients Only)  Admission Status Voluntary  Psychosocial Assessment  Patient Complaints Anxiety;Suspiciousness;Worrying  Eye Contact Fair  Facial Expression Worried;Anxious  Affect Anxious;Appropriate to circumstance  Speech Logical/coherent  Interaction Assertive;Cautious;Superficial  Motor Activity Slow  Appearance/Hygiene In scrubs  Behavior Characteristics Cooperative  Mood Anxious;Suspicious  Thought Process  Coherency WDL  Content Blaming others (see prior)  Delusions Persecutory;Paranoid  Perception Hallucinations (denies)  Hallucination Auditory  Judgment Poor  Confusion None  Danger to Self  Current suicidal ideation? Denies  Danger to Others  Danger to Others None reported or observed

## 2021-02-25 MED ORDER — RISPERIDONE 2 MG PO TBDP
4.0000 mg | ORAL_TABLET | Freq: Every day | ORAL | Status: DC
  Administered 2021-02-25 – 2021-02-27 (×3): 4 mg via ORAL
  Filled 2021-02-25 (×2): qty 2
  Filled 2021-02-25: qty 1
  Filled 2021-02-25 (×3): qty 2

## 2021-02-25 NOTE — Progress Notes (Signed)
Pt up to the nursing station requesting something to help her sleep, pt informed she received enough to help her sleep. While talking pt had a hard time keeping her eyes open, pt encouraged to go back to her room and relax and she should go back to sleep.

## 2021-02-25 NOTE — Progress Notes (Signed)
[facility] MD Progress Note  02/25/2021 1:15 PM Ashley Vega  MRN:  [id number]  Subjective: Ashley Vega is a 30 y.o. female with a history of schizophrenia, who was initially admitted for inpatient psychiatric hospitalization on 02/20/2021 for management of increased AH. The patient is currently on Hospital Day 5.   Chart Review from last 24 hours:  The patient's chart was reviewed and nursing notes were reviewed. The patient's case was discussed in multidisciplinary team meeting. Per nursing, she has had disorganized thinking but was able to attend some groups. She is intrusive at times. She has had no behavioral issues noted. Per MAR she has been compliant with scheduled medications and received Trazodone for sleep.   Information Obtained Today During Patient Interview: The patient was seen and evaluated on the unit. She states she did sleep well last night and reports good appetite. She slept 4.75 hours last night. She was prompted to take a shower. She state she would. She denies paranoia, AH, and denies SI or HI. She admits to some residual VH of "her vision popping out" but cannot elaborate further. She talks about the bed in her room not being hers but that she designed it and will make it her discharge bed. She can be intrusive and walks up to the nurses station frequently asking "is there anything I can take?" She denies medication side-effects and voices no physical complaints. She is visualized responding to internal stimuli and talking to herself. Will increase her Risperdal to 4 mg at bedtime for better symptom control. Patient is still not sleeping well. Will continue to monitor.    Principal Problem: Schizophrenia ([place]) Diagnosis: Principal Problem:   Schizophrenia ([place])  Total Time Spent in Direct Patient Care: 25 minutes  Past Psychiatric History: see admission H&P  Past Medical History:  Past Medical History:  Diagnosis Date   Fibroids    No pertinent past medical history     Past  Surgical History:  Procedure Laterality Date   NO PAST SURGERIES     Family History:  Family History  Problem Relation Age of Onset   Cancer Maternal Grandmother    Hypertension Maternal Grandfather    Kidney disease Maternal Grandfather    Other Neg Hx    Family Psychiatric  History: see admission H&P  Social History:  Social History   Substance and Sexual Activity  Alcohol Use No     Social History   Substance and Sexual Activity  Drug Use No    Social History   Socioeconomic History   Marital status: Single    Spouse name: Not on file   Number of children: Not on file   Years of education: Not on file   Highest education level: Not on file  Occupational History   Not on file  Tobacco Use   Smoking status: Former    Pack years: 0.00    Types: Cigars   Smokeless tobacco: Never  Vaping Use   Vaping Use: Never used  Substance and Sexual Activity   Alcohol use: No   Drug use: No   Sexual activity: Yes    Birth control/protection: None  Other Topics Concern   Not on file  Social History Narrative   Not on file   Social Determinants of Health   Financial Resource Strain: Not on file  Food Insecurity: Not on file  Transportation Needs: Not on file  Physical Activity: Not on file  Stress: Not on file  Social Connections: Not on file  Sleep: Poor  Appetite:  Good  Current Medications: Current Facility-Administered Medications  Medication Dose Route Frequency Provider Last Rate Last Admin   acetaminophen (TYLENOL) tablet 650 mg  650 mg Oral Q6H PRN Sharma Covert, MD       alum & mag hydroxide-simeth (MAALOX/MYLANTA) 200-200-20 MG/5ML suspension 30 mL  30 mL Oral Q4H PRN Sharma Covert, MD       benztropine (COGENTIN) tablet 0.5 mg  0.5 mg Oral BID PRN Harlow Asa, MD       docusate sodium (COLACE) capsule 100 mg  100 mg Oral Daily Nelda Marseille, Amy E, MD   100 mg at 02/25/21 0756   ferrous sulfate tablet 325 mg  325 mg Oral Q breakfast  Harlow Asa, MD   325 mg at 02/25/21 0756   risperiDONE (RISPERDAL M-TABS) disintegrating tablet 2 mg  2 mg Oral Q8H PRN Sharma Covert, MD   2 mg at 02/24/21 1652   And   LORazepam (ATIVAN) tablet 1 mg  1 mg Oral Q6H PRN Sharma Covert, MD   1 mg at 02/23/21 2352   And   ziprasidone (GEODON) injection 20 mg  20 mg Intramuscular Q12H PRN Sharma Covert, MD       magnesium hydroxide (MILK OF MAGNESIA) suspension 30 mL  30 mL Oral Daily PRN Sharma Covert, MD       melatonin tablet 6 mg  6 mg Oral QHS Nelda Marseille, Amy E, MD   6 mg at 02/24/21 2043   risperiDONE (RISPERDAL M-TABS) disintegrating tablet 2 mg  2 mg Oral Daily Viann Fish E, MD   2 mg at 02/25/21 0756   risperiDONE (RISPERDAL M-TABS) disintegrating tablet 3 mg  3 mg Oral QHS Nelda Marseille, Amy E, MD   3 mg at 02/24/21 2043   traZODone (DESYREL) tablet 200 mg  200 mg Oral QHS Harlow Asa, MD   200 mg at 02/24/21 2043    Lab Results:  Results for orders placed or performed during the hospital encounter of 02/20/21 (from the past 48 hour(s))  Hemoglobin A1c     Status: Abnormal   Collection Time: 02/23/21  6:22 PM  Result Value Ref Range   Hgb A1c MFr Bld 6.0 (H) 4.8 - 5.6 %    Comment: (NOTE) Pre diabetes:          5.7%-6.4%  Diabetes:              >6.4%  Glycemic control for   <7.0% adults with diabetes    Mean Plasma Glucose 125.5 mg/dL    Comment: Performed at [facility], [place] [address], [place], [place] [address]    Blood Alcohol level:  Lab Results  Component Value Date   [facility] <10 02/20/2021   ETH <10 00/76/2263    Metabolic Disorder Labs: Lab Results  Component Value Date   HGBA1C 6.0 (H) 02/23/2021   MPG 125.5 02/23/2021   No results found for: PROLACTIN Lab Results  Component Value Date   CHOL 151 02/21/2021   TRIG 55 02/21/2021   HDL 35 (L) 02/21/2021   CHOLHDL 4.3 02/21/2021   VLDL 11 02/21/2021   LDLCALC 105 (H) 02/21/2021    Physical Findings: AIMS: Facial and  Oral Movements Muscles of Facial Expression: None, normal Lips and Perioral Area: None, normal Jaw: None, normal Tongue: None, normal,Extremity Movements Upper (arms, wrists, hands, fingers): None, normal Lower (legs, knees, ankles, toes): None, normal, Trunk Movements Neck, shoulders, hips:  None, normal, Overall Severity Severity of abnormal movements (highest score from questions above): None, normal Incapacitation due to abnormal movements: None, normal Patient's awareness of abnormal movements (rate only patient's report): No Awareness, Dental Status Current problems with teeth and/or dentures?: No Does patient usually wear dentures?: No     Musculoskeletal: Strength & Muscle Tone: within normal limits Gait & Station: normal, steady Patient leans: N/A  Psychiatric Specialty Exam: Physical Exam Vitals reviewed.  HENT:     Head: Normocephalic.  Pulmonary:     Effort: Pulmonary effort is normal.  Neurological:     General: No focal deficit present.     Mental Status: She is alert.    Review of Systems  Respiratory:  Negative for shortness of breath.   Cardiovascular:  Negative for chest pain.  Gastrointestinal:  Negative for constipation, diarrhea, nausea and vomiting.   Blood pressure 140/68, pulse 100, temperature 97.7 F (36.5 C), temperature source Oral, height 5\' 6"  (1.676 m), weight 99.8 kg, last menstrual period 02/18/2021, SpO2 100 %.Body mass index is 35.51 kg/m.  General Appearance:  more disheveled today, casually dressed  Eye Contact:  Good  Speech:  Clear and Coherent and Normal Rate  Volume:  Normal  Mood:   aloof  Affect:   mildly guarded, calm, polite  Thought Process:  Goal Directed but concrete  Orientation:  oriented to self and place and time  Thought Content:  Endorses VH, but denies AH, ideas of reference, or first rank symptoms - guarded on exam  Suicidal Thoughts:  No  Homicidal Thoughts:  No  Memory:  Recent;   Fair  Judgement:  Fair   Insight:  Lacking  Psychomotor Activity:  Normal  Concentration:  Concentration: Fair and Attention Span: Fair  Recall:  [employer] of Knowledge:  Fair  Language:  Good  Akathisia:  Negative  Assets:  Communication Skills Desire for Improvement Resilience Social Support  ADL's:  Intact  Cognition:  WNL  Sleep:  Number of Hours: 4.75   Treatment Plan Summary: Diagnoses / Active Problems: Schizophrenia by hx  PLAN: Safety and Monitoring:  -- Voluntary admission to inpatient psychiatric unit for safety, stabilization and treatment  -- Daily contact with patient to assess and evaluate symptoms and progress in treatment  -- Patient's case to be discussed in multi-disciplinary team meeting  -- Observation Level : q15 minute checks  -- Vital signs:  q12 hours  -- Precautions: suicide  2. Psychiatric Diagnoses and Treatment:   Schizophrenia by hx  -- Increase Risperdal to 2mg  qam and 4mg  po qhs   -- Continue Cogentin 0.5 mg bid PRN EPS  -- Continue Risperdal agitation protocol PRN  -- Increase Trazodone to 200mg  po qhs scheduled with Melatonin 6mg  po qhs  -- Metabolic profile and EKG monitoring obtained while on an atypical antipsychotic (Lipid Panel: cholesterol 151, Triglycerides 55, HDL 35, LDL 105; HbgA1c: 6.0; QTc:45ms)   -- Encouraged patient to participate in unit milieu and in scheduled group therapies   -- Short Term Goals: Ability to demonstrate self-control will improve and Ability to identify and develop effective coping behaviors will improve  -- Long Term Goals: Improvement in symptoms so as ready for discharge   3. Medical Issues Being Addressed:   Anemia with low Ferritin  -- Continue Ferrous Sulfate 325mg  daily with Colace 100mg  daily  4. Discharge Planning:   -- Social work and case management to assist with discharge planning and identification of hospital follow-up needs prior to discharge  --  Estimated LOS: 4-5 days  -- Discharge Concerns: Need to  establish a safety plan; Medication compliance and effectiveness  -- Discharge Goals: Return home with outpatient referrals for mental health follow-up including medication management/psychotherapy  Ethelene Hal, NP 02/25/2021, 5:27 PM

## 2021-02-25 NOTE — [facility] Group Notes (Signed)
LCSW Group Therapy Note 02/25/2021  11:15am-12:00pm  Type of Therapy and Topic:  Group Therapy: Anger and Commonalities  Participation Level:  Active   Description of Group: In this group, patients initially shared an "unknown" fact about themselves and CSW led a discussion about the ways in which we have things in common without realizing it.  Patient then identified a recent time they became angry and how this yet again showed a way in which they had something in common with other patients  We discussed possible unhealthy reactions to anger and possible healthy reactions.  We also discussed possible underlying emotions that lead to the anger.  Commonalities among group members were pointed out throughout the entirety of group.  Therapeutic Goals: Patients were asked to share something about themselves and learned that they often have things in common with other people without knowing this Patients will remember their last incident of anger and how they reacted Patients will be able to identify their reaction as healthy or unhealthy, and identify possible reactions that would have been the opposite Patients will learn that anger itself is a secondary emotion and will think about their primary emotion at the time of their last incident of anger  Summary of Patient Progress:  The patient shared that something interesting she could share about herself is that she likes the color pink and she is considering getting a tattoo.  This was expressed as also present in several other patients, so the patient was able to recognize that she is not alone.  The patient also said that her most recent time of anger was yesterday and said her anger was caused by everybody else getting a blue journal whereas she did not, and she really needed one.  Her conclusion by the end of group was that she does have a lot in common with other patients and can lean on them for support.  Therapeutic Modalities:   Cognitive  Behavioral Therapy  Maretta Los, LCSW 02/25/2021  2:37 PM

## 2021-02-25 NOTE — Progress Notes (Signed)
   02/25/21 0500  Sleep  Number of Hours 4.75

## 2021-02-25 NOTE — Plan of Care (Signed)
°  Problem: Education: °Goal: Emotional status will improve °Outcome: Progressing °Goal: Mental status will improve °Outcome: Progressing °Goal: Verbalization of understanding the information provided will improve °Outcome: Progressing °  °

## 2021-02-25 NOTE — Progress Notes (Signed)
Did not attend group 

## 2021-02-25 NOTE — Progress Notes (Signed)
Progress note  Pt found in bed; compliant with medication administration. Pt seemed more paranoid this morning, requesting their armband back from their medication drawer. "I don't trust what ya'll will do with it". Pt continues to be fidgety. Pt can be needy and interacts with most people that come on the hall. Pt has asked all available staff for appropriate undergarments that the hospital doesn't provide. Pt was educated on family bringing these. Pt educated on self care needs and what this entails. Pt lacks understanding or action. Pt denies si/hi/ah/vh and verbally agrees to approach staff if these become apparent or before harming themselves/others while at [facility].  A: Pt provided support and encouragement. Pt given medication per protocol and standing orders. Q22m safety checks implemented and continued.  R: Pt safe on the unit. Will continue to monitor.

## 2021-02-26 MED ORDER — BENZTROPINE MESYLATE 0.5 MG PO TABS
0.5000 mg | ORAL_TABLET | Freq: Two times a day (BID) | ORAL | Status: DC
  Administered 2021-02-26 – 2021-02-28 (×4): 0.5 mg via ORAL
  Filled 2021-02-26 (×10): qty 1

## 2021-02-26 MED ORDER — ZOLPIDEM TARTRATE 5 MG PO TABS
10.0000 mg | ORAL_TABLET | Freq: Every evening | ORAL | Status: DC | PRN
  Administered 2021-02-27: 10 mg via ORAL
  Filled 2021-02-26: qty 2

## 2021-02-26 NOTE — Progress Notes (Signed)
At 0212 pt presents with complaints of high anxiety and inability to sleep.  Administered PRN 1 mg Ativan per MAR, per pt request.

## 2021-02-26 NOTE — [facility] Group Notes (Signed)
[facility] LCSW Group Therapy Note  Date/Time:  02/26/2021  11:00AM-12:00PM  Type of Therapy and Topic:  Group Therapy:  Music and Mood  Participation Level:  Active   Description of Group: In this process group, members listened to a variety of genres of music and identified that different types of music evoke different responses.  Patients were encouraged to identify music that was soothing for them and music that was energizing for them.  Patients discussed how this knowledge can help with wellness and recovery in various ways including managing depression and anxiety as well as encouraging healthy sleep habits.    Therapeutic Goals: Patients will explore the impact of different varieties of music on mood Patients will verbalize the thoughts they have when listening to different types of music Patients will identify music that is soothing to them as well as music that is energizing to them Patients will discuss how to use this knowledge to assist in maintaining wellness and recovery Patients will explore the use of music as a coping skill  Summary of Patient Progress:  At the beginning of group, patient expressed that she felt good but a little drowsy.  She participated fully.  At the end of group, patient expressed that she felt more awake and alert.  She remains in need of a shower.  Therapeutic Modalities: Solution Focused Brief Therapy Activity   Selmer Dominion, LCSW

## 2021-02-26 NOTE — Progress Notes (Signed)
Pt did not attend orientation/goals group. 

## 2021-02-26 NOTE — Plan of Care (Signed)
  Problem: Coping: Goal: Ability to identify and develop effective coping behavior will improve Outcome: Not Progressing   Problem: Self-Concept: Goal: Level of anxiety will decrease Outcome: Not Progressing   Problem: Activity: Goal: Sleeping patterns will improve Outcome: Not Progressing

## 2021-02-26 NOTE — Progress Notes (Signed)
Pt did not attend psychoeducational group. 

## 2021-02-26 NOTE — Progress Notes (Signed)
[facility] MD Progress Note  02/26/2021 11:47 AM Ashley Vega  MRN:  [id number]  Subjective: "I am doping very well, thank you"  Ashley Vega is a 30 y.o. female with a history of schizophrenia, who was initially admitted for inpatient psychiatric hospitalization on 02/20/2021 for management of increased AH. The patient is currently on Hospital Day 6.   Chart Review from last 24 hours:  The patient's chart was reviewed and nursing notes were reviewed. The patient's case was discussed in multidisciplinary team meeting. Per nursing, she has had disorganized thinking but was able to attend some groups. She is intrusive at times. She has had no behavioral issues noted. Per MAR she has been compliant with scheduled medications and received Trazodone and melatonin for sleep.   Information Obtained Today During Patient Interview: The patient was seen and evaluated on the unit. She states she did sleep well last night and reports good appetite. According to nursing and chart review shew is still not sleeping well, she slept 2.75 hours last night. She denies paranoia, VH, AH, and denies SI or HI. She does continue to state she has vision problems but can not elaborate on what the problem is. She continue to have some disorganized thinking. She stated she wants to get disability and asks what is better, "social security or disability." I explained that disability is a form of social security. She continues to be intrusive at times. She is attending group therapy and enjoys working on word search puzzles. She denies medication side-effects and voices no physical complaints. She continues to respond to internal stimuli but less frequently. Her Risperdal was increased yesterday from 3 mg to 4 mg at bedtime, she takes 2 mg in the morning. I will schedule her Cogentin 0.5 mg BID from PRN to BID, to avoid EPS with the increased dose of Risperdal. She currently is not displaying any signs of EPS. I will add Ambien tonight in an effort  to get her better/more sleep. Will continue to monitor for medication effectiveness and safety.     Principal Problem: Schizophrenia ([place]) Diagnosis: Principal Problem:   Schizophrenia ([place])  Total Time Spent in Direct Patient Care: 25 minutes  Past Psychiatric History: see admission H&P  Past Medical History:  Past Medical History:  Diagnosis Date   Fibroids    No pertinent past medical history     Past Surgical History:  Procedure Laterality Date   NO PAST SURGERIES     Family History:  Family History  Problem Relation Age of Onset   Cancer Maternal Grandmother    Hypertension Maternal Grandfather    Kidney disease Maternal Grandfather    Other Neg Hx    Family Psychiatric  History: see admission H&P  Social History:  Social History   Substance and Sexual Activity  Alcohol Use No     Social History   Substance and Sexual Activity  Drug Use No    Social History   Socioeconomic History   Marital status: Single    Spouse name: Not on file   Number of children: Not on file   Years of education: Not on file   Highest education level: Not on file  Occupational History   Not on file  Tobacco Use   Smoking status: Former    Pack years: 0.00    Types: Cigars   Smokeless tobacco: Never  Vaping Use   Vaping Use: Never used  Substance and Sexual Activity   Alcohol use: No   Drug use:  No   Sexual activity: Yes    Birth control/protection: None  Other Topics Concern   Not on file  Social History Narrative   Not on file   Social Determinants of Health   Financial Resource Strain: Not on file  Food Insecurity: Not on file  Transportation Needs: Not on file  Physical Activity: Not on file  Stress: Not on file  Social Connections: Not on file   Sleep: Poor  Appetite:  Good  Current Medications: Current Facility-Administered Medications  Medication Dose Route Frequency Provider Last Rate Last Admin   acetaminophen (TYLENOL) tablet 650 mg  650 mg Oral  Q6H PRN Sharma Covert, MD       alum & mag hydroxide-simeth (MAALOX/MYLANTA) 200-200-20 MG/5ML suspension 30 mL  30 mL Oral Q4H PRN Sharma Covert, MD       benztropine (COGENTIN) tablet 0.5 mg  0.5 mg Oral BID PRN Nelda Marseille, Amy E, MD   0.5 mg at 02/25/21 1404   docusate sodium (COLACE) capsule 100 mg  100 mg Oral Daily Nelda Marseille, Amy E, MD   100 mg at 02/26/21 0756   ferrous sulfate tablet 325 mg  325 mg Oral Q breakfast Viann Fish E, MD   325 mg at 02/26/21 0756   risperiDONE (RISPERDAL M-TABS) disintegrating tablet 2 mg  2 mg Oral Q8H PRN Sharma Covert, MD   2 mg at 02/24/21 1652   And   LORazepam (ATIVAN) tablet 1 mg  1 mg Oral Q6H PRN Sharma Covert, MD   1 mg at 02/26/21 6384   And   ziprasidone (GEODON) injection 20 mg  20 mg Intramuscular Q12H PRN Sharma Covert, MD       magnesium hydroxide (MILK OF MAGNESIA) suspension 30 mL  30 mL Oral Daily PRN Sharma Covert, MD       melatonin tablet 6 mg  6 mg Oral QHS Nelda Marseille, Amy E, MD   6 mg at 02/25/21 2101   risperiDONE (RISPERDAL M-TABS) disintegrating tablet 2 mg  2 mg Oral Daily Nelda Marseille, Amy E, MD   2 mg at 02/26/21 0756   risperiDONE (RISPERDAL M-TABS) disintegrating tablet 4 mg  4 mg Oral QHS Ethelene Hal, NP   4 mg at 02/25/21 2102   traZODone (DESYREL) tablet 200 mg  200 mg Oral QHS Nelda Marseille, Amy E, MD   200 mg at 02/25/21 2102   zolpidem (AMBIEN) tablet 10 mg  10 mg Oral QHS PRN Ethelene Hal, NP        Lab Results: No results found for this or any previous visit (from the past 9 hour(s)).   Blood Alcohol level:  Lab Results  Component Value Date   ETH <10 02/20/2021   ETH <10 66/59/9357    Metabolic Disorder Labs: Lab Results  Component Value Date   HGBA1C 6.0 (H) 02/23/2021   MPG 125.5 02/23/2021   No results found for: PROLACTIN Lab Results  Component Value Date   CHOL 151 02/21/2021   TRIG 55 02/21/2021   HDL 35 (L) 02/21/2021   CHOLHDL 4.3 02/21/2021   VLDL  11 02/21/2021   LDLCALC 105 (H) 02/21/2021    Physical Findings: AIMS: Facial and Oral Movements Muscles of Facial Expression: None, normal Lips and Perioral Area: None, normal Jaw: None, normal Tongue: None, normal,Extremity Movements Upper (arms, wrists, hands, fingers): None, normal Lower (legs, knees, ankles, toes): None, normal, Trunk Movements Neck, shoulders, hips: None, normal, Overall Severity Severity of abnormal movements (  highest score from questions above): None, normal Incapacitation due to abnormal movements: None, normal Patient's awareness of abnormal movements (rate only patient's report): No Awareness, Dental Status Current problems with teeth and/or dentures?: No Does patient usually wear dentures?: No     Musculoskeletal: Strength & Muscle Tone: within normal limits Gait & Station: normal, steady Patient leans: N/A  Psychiatric Specialty Exam: Physical Exam Vitals and nursing note reviewed.  HENT:     Head: Normocephalic.  Pulmonary:     Effort: Pulmonary effort is normal.  Musculoskeletal:        General: Normal range of motion.     Cervical back: Normal range of motion.  Neurological:     General: No focal deficit present.     Mental Status: She is alert.  Psychiatric:        Attention and Perception: Attention normal. She does not perceive auditory or visual hallucinations.        Mood and Affect: Mood normal.        Speech: Speech normal.        Behavior: Behavior normal. Behavior is cooperative.        Thought Content: Thought content is not paranoid.        Cognition and Memory: Cognition normal.    Review of Systems  Constitutional: Negative.   HENT:  Negative for sinus pain and sore throat.   Respiratory: Negative.  Negative for shortness of breath.   Cardiovascular:  Negative for chest pain.  Gastrointestinal: Negative.  Negative for constipation, diarrhea, nausea and vomiting.  Endocrine: Negative.   Genitourinary: Negative.    Musculoskeletal: Negative.   Neurological: Negative.    Blood pressure 128/81, pulse 97, temperature 97.8 F (36.6 C), temperature source Oral, height 5\' 6"  (1.676 m), weight 99.8 kg, last menstrual period 02/18/2021, SpO2 100 %.Body mass index is 35.51 kg/m.  General Appearance:  more disheveled today, casually dressed  Eye Contact:  Good  Speech:  Clear and Coherent and Normal Rate  Volume:  Normal  Mood:   aloof  Affect:   mildly guarded, calm, polite  Thought Process:  Goal Directed but concrete  Orientation:  oriented to self and place and time  Thought Content:  Endorses VH, but denies AH, ideas of reference, or first rank symptoms - guarded on exam  Suicidal Thoughts:  No  Homicidal Thoughts:  No  Memory:  Recent;   Fair  Judgement:  Fair  Insight:  Lacking  Psychomotor Activity:  Normal  Concentration:  Concentration: Fair and Attention Span: Fair  Recall:  [employer] of Knowledge:  Fair  Language:  Good  Akathisia:  Negative  Assets:  Communication Skills Desire for Improvement Resilience Social Support  ADL's:  Intact  Cognition:  WNL  Sleep:  Number of Hours: 2.75   Treatment Plan Summary: Diagnoses / Active Problems: Schizophrenia by hx  PLAN: Safety and Monitoring:  -- Voluntary admission to inpatient psychiatric unit for safety, stabilization and treatment  -- Daily contact with patient to assess and evaluate symptoms and progress in treatment  -- Patient's case to be discussed in multi-disciplinary team meeting  -- Observation Level : q15 minute checks  -- Vital signs:  q12 hours  -- Precautions: suicide  2. Psychiatric Diagnoses and Treatment:   Schizophrenia by hx  -- Increase Risperdal to 2mg  qam and 4mg  po qhs   -- Schedule Cogentin 0.5 mg BID instead of PRN for EPS  -- Continue Risperdal agitation protocol PRN -- Increase Trazodone to  200mg  po qhs scheduled with Melatonin 6mg  po qhs  --Initiate Ambien 10 mg PO at bedtime for sleep  --  Metabolic profile and EKG monitoring obtained while on an atypical antipsychotic (Lipid Panel: cholesterol 151, Triglycerides 55, HDL 35, LDL 105; HbgA1c: 6.0; QTc:464ms)   -- Encouraged patient to participate in unit milieu and in scheduled group therapies   -- Short Term Goals: Ability to demonstrate self-control will improve and Ability to identify and develop effective coping behaviors will improve  -- Long Term Goals: Improvement in symptoms so as ready for discharge   3. Medical Issues Being Addressed:   Anemia with low Ferritin  -- Continue Ferrous Sulfate 325mg  daily with Colace 100mg  daily  4. Discharge Planning:   -- Social work and case management to assist with discharge planning and identification of hospital follow-up needs prior to discharge  -- Estimated LOS: 4-5 days  -- Discharge Concerns: Need to establish a safety plan; Medication compliance and effectiveness  -- Discharge Goals: Return home with outpatient referrals for mental health follow-up including medication management/psychotherapy   Ethelene Hal, NP 02/26/2021, 3:02 PM

## 2021-02-26 NOTE — Progress Notes (Signed)
Progress note    02/26/21 0756  Psych Admission Type (Psych Patients Only)  Admission Status Voluntary  Psychosocial Assessment  Patient Complaints Anxiety  Eye Contact Fair  Facial Expression Animated;Anxious;Pensive  Affect Anxious;Preoccupied  Speech Logical/coherent  Interaction Assertive;Needy  Motor Activity Restless  Appearance/Hygiene Improved  Behavior Characteristics Cooperative;Appropriate to situation;Anxious  Mood Anxious;Pleasant  Thought Process  Coherency Concrete thinking  Content Paranoia  Delusions Paranoid  Perception Hallucinations  Hallucination Auditory  Judgment Poor  Confusion None  Danger to Self  Current suicidal ideation? Denies  Danger to Others  Danger to Others None reported or observed

## 2021-02-26 NOTE — [facility] Group Notes (Signed)
Adult Psychoeducational Group Note  Date:  02/26/2021 Time:  9:33 PM  Group Topic/Focus:  Wrap-Up Group:   The focus of this group is to help patients review their daily goal of treatment and discuss progress on daily workbooks.  Participation Level:  Minimal  Participation Quality:  Appropriate and Attentive  Affect:  Appropriate  Cognitive:  Alert and Appropriate  Insight: Appropriate and Good  Engagement in Group:  Engaged  Modes of Intervention:  Discussion and Education  Additional Comments:  Pt attended and participated in wrap up group this evening and rated their day a 10/10, due to them waking up early, showering and taking their med's. Pt has an ongoing goal to be more interactive in group.   Cristi Loron 02/26/2021, 9:33 PM

## 2021-02-26 NOTE — Progress Notes (Addendum)
  D:  Pt presents with high anxiety and depression.  Pt assessed at bedside.  Pt denies SI/HI, and verbally contracts for safety.  Pt denies VH, but reports AH stating, "it's like distant voices."  A:  Labs/Vitals monitored; Medication education provided; Pt encouraged to communicate concerns.  R;  Pt remains safe on unit with Q 15 minute safety checks.  Will continue POC.     02/25/21 2102  Psych Admission Type (Psych Patients Only)  Admission Status Voluntary  Psychosocial Assessment  Patient Complaints Anxiety;Depression;Suspiciousness  Eye Contact Fair  Facial Expression Animated;Anxious;Pensive;Worried  Affect Anxious;Preoccupied  Cytogeneticist Activity Restless  Appearance/Hygiene Programmer, applications Cooperative;Anxious;Fidgety  Mood Anxious;Preoccupied;Suspicious  Thought Process  Coherency Concrete thinking  Content Paranoia  Delusions Paranoid  Perception Hallucinations  Hallucination Auditory  Judgment Poor  Confusion None  Danger to Self  Current suicidal ideation? Denies  Danger to Others  Danger to Others None reported or observed

## 2021-02-26 NOTE — Plan of Care (Signed)
  Problem: Coping: Goal: Ability to verbalize frustrations and anger appropriately will improve Outcome: Progressing Goal: Ability to demonstrate self-control will improve Outcome: Progressing   Problem: Health Behavior/Discharge Planning: Goal: Identification of resources available to assist in meeting health care needs will improve Outcome: Progressing

## 2021-02-27 MED ORDER — CLONAZEPAM 0.5 MG PO TABS
0.5000 mg | ORAL_TABLET | Freq: Two times a day (BID) | ORAL | Status: DC
  Administered 2021-02-27 – 2021-03-02 (×6): 0.5 mg via ORAL
  Filled 2021-02-27 (×6): qty 1

## 2021-02-27 MED ORDER — WHITE PETROLATUM EX OINT
TOPICAL_OINTMENT | CUTANEOUS | Status: AC
  Filled 2021-02-27: qty 5

## 2021-02-27 MED ORDER — CLONAZEPAM 0.5 MG PO TABS
0.5000 mg | ORAL_TABLET | Freq: Once | ORAL | Status: AC
  Administered 2021-02-27: 0.5 mg via ORAL
  Filled 2021-02-27: qty 1

## 2021-02-27 NOTE — Progress Notes (Signed)
Pt up at the nursing station requesting "a sleeping pill" pt given PRN Ambien per [facility]

## 2021-02-27 NOTE — Plan of Care (Signed)
  Problem: Education: Goal: Ability to state activities that reduce stress will improve Outcome: Not Progressing   Problem: Coping: Goal: Ability to identify and develop effective coping behavior will improve Outcome: Not Progressing   Problem: Self-Concept: Goal: Level of anxiety will decrease Outcome: Not Progressing

## 2021-02-27 NOTE — Progress Notes (Signed)
Adult Psychoeducational Group Note  Date:  02/27/2021 Time:  9:29 PM  Group Topic/Focus:  Wrap-Up Group:   The focus of this group is to help patients review their daily goal of treatment and discuss progress on daily workbooks.  Participation Level:  Did Not Attend  Participation Quality:   Did Not Attend  Affect:  Did Not Attend  Cognitive:  Did Not Attend  Insight: None  Engagement in Group:  Did Not Attend  Modes of Intervention:  Did Not Attend  Additional Comments:  Pt did not attend evening wrap up group tonight.  Candy Sledge 02/27/2021, 9:29 PM

## 2021-02-27 NOTE — Progress Notes (Signed)
[facility] MD Progress Note  02/27/2021 11:20 AM Ashley Vega  MRN:  [id number]  Subjective: "I am doping very well, thank you"  Ashley Vega is a 30 y.o. female with a history of schizophrenia, who was initially admitted for inpatient psychiatric hospitalization on 02/20/2021 for management of increased AH. The patient is currently on Hospital Day 7.   Chart Review from last 24 hours:  The patient's chart was reviewed and nursing notes were reviewed. The patient's case was discussed in multidisciplinary team meeting. Per nursing, she has had disorganized thinking but was able to attend some groups. She is intrusive at times. She has had no behavioral issues noted. Per MAR she has been compliant with scheduled medications and received Trazodone and melatonin for sleep.   Information Obtained Today During Patient Interview: The patient was seen and evaluated on the unit. She states she did sleep well last night and reports good appetite. According to nursing and chart review she did sleep better last night with Ambien, she slept 5.25 hours. Her appetite is good. She denies paranoia, VH, AH, and denies SI or HI. She does continue to state she has vision problems but can not elaborate on what the problem is. Her movements are slow and she speaks slowly. She has not showered in several days and is wearing the same clothes since Saturday. Her thought process is less disorganized than previously. She continues to be up in the middle of the night, pacing in the hall. She received Ativan 1 mg PO PRN at 0212 this morning. Klonopin 0.5 mg BID added today for anxiety. She is attending group therapy and enjoys working on word search puzzles. She denies medication side-effects and voices no physical complaints. She continues to respond to internal stimuli but less frequently.  Will continue to monitor for medication effectiveness and safety.     Principal Problem: Schizophrenia ([place]) Diagnosis: Principal Problem:    Schizophrenia ([place])  Total Time Spent in Direct Patient Care: 25 minutes  Past Psychiatric History: see admission H&P  Past Medical History:  Past Medical History:  Diagnosis Date   Fibroids    No pertinent past medical history     Past Surgical History:  Procedure Laterality Date   NO PAST SURGERIES     Family History:  Family History  Problem Relation Age of Onset   Cancer Maternal Grandmother    Hypertension Maternal Grandfather    Kidney disease Maternal Grandfather    Other Neg Hx    Family Psychiatric  History: see admission H&P  Social History:  Social History   Substance and Sexual Activity  Alcohol Use No     Social History   Substance and Sexual Activity  Drug Use No    Social History   Socioeconomic History   Marital status: Single    Spouse name: Not on file   Number of children: Not on file   Years of education: Not on file   Highest education level: Not on file  Occupational History   Not on file  Tobacco Use   Smoking status: Former    Pack years: 0.00    Types: Cigars   Smokeless tobacco: Never  Vaping Use   Vaping Use: Never used  Substance and Sexual Activity   Alcohol use: No   Drug use: No   Sexual activity: Yes    Birth control/protection: None  Other Topics Concern   Not on file  Social History Narrative   Not on file   Social  Determinants of Health   Financial Resource Strain: Not on file  Food Insecurity: Not on file  Transportation Needs: Not on file  Physical Activity: Not on file  Stress: Not on file  Social Connections: Not on file   Sleep: Poor  Appetite:  Good  Current Medications: Current Facility-Administered Medications  Medication Dose Route Frequency Provider Last Rate Last Admin   acetaminophen (TYLENOL) tablet 650 mg  650 mg Oral Q6H PRN Sharma Covert, MD       alum & mag hydroxide-simeth (MAALOX/MYLANTA) 200-200-20 MG/5ML suspension 30 mL  30 mL Oral Q4H PRN Sharma Covert, MD        benztropine (COGENTIN) tablet 0.5 mg  0.5 mg Oral BID Ethelene Hal, NP   0.5 mg at 02/27/21 7741   docusate sodium (COLACE) capsule 100 mg  100 mg Oral Daily Nelda Marseille, Amy E, MD   100 mg at 02/27/21 2878   ferrous sulfate tablet 325 mg  325 mg Oral Q breakfast Harlow Asa, MD   325 mg at 02/27/21 6767   risperiDONE (RISPERDAL M-TABS) disintegrating tablet 2 mg  2 mg Oral Q8H PRN Sharma Covert, MD   2 mg at 02/24/21 1652   And   LORazepam (ATIVAN) tablet 1 mg  1 mg Oral Q6H PRN Sharma Covert, MD   1 mg at 02/26/21 2094   And   ziprasidone (GEODON) injection 20 mg  20 mg Intramuscular Q12H PRN Sharma Covert, MD       magnesium hydroxide (MILK OF MAGNESIA) suspension 30 mL  30 mL Oral Daily PRN Sharma Covert, MD       melatonin tablet 6 mg  6 mg Oral QHS Nelda Marseille, Amy E, MD   6 mg at 02/26/21 2114   risperiDONE (RISPERDAL M-TABS) disintegrating tablet 2 mg  2 mg Oral Daily Nelda Marseille, Amy E, MD   2 mg at 02/27/21 7096   risperiDONE (RISPERDAL M-TABS) disintegrating tablet 4 mg  4 mg Oral QHS Ethelene Hal, NP   4 mg at 02/26/21 2116   traZODone (DESYREL) tablet 200 mg  200 mg Oral QHS Nelda Marseille, Amy E, MD   200 mg at 02/26/21 2115   zolpidem (AMBIEN) tablet 10 mg  10 mg Oral QHS PRN Ethelene Hal, NP        Lab Results: No results found for this or any previous visit (from the past 84 hour(s)).   Blood Alcohol level:  Lab Results  Component Value Date   ETH <10 02/20/2021   ETH <10 28/36/6294    Metabolic Disorder Labs: Lab Results  Component Value Date   HGBA1C 6.0 (H) 02/23/2021   MPG 125.5 02/23/2021   No results found for: PROLACTIN Lab Results  Component Value Date   CHOL 151 02/21/2021   TRIG 55 02/21/2021   HDL 35 (L) 02/21/2021   CHOLHDL 4.3 02/21/2021   VLDL 11 02/21/2021   LDLCALC 105 (H) 02/21/2021    Physical Findings: AIMS: Facial and Oral Movements Muscles of Facial Expression: None, normal Lips and Perioral  Area: None, normal Jaw: None, normal Tongue: None, normal,Extremity Movements Upper (arms, wrists, hands, fingers): None, normal Lower (legs, knees, ankles, toes): None, normal, Trunk Movements Neck, shoulders, hips: None, normal, Overall Severity Severity of abnormal movements (highest score from questions above): None, normal Incapacitation due to abnormal movements: None, normal Patient's awareness of abnormal movements (rate only patient's report): No Awareness, Dental Status Current problems with teeth and/or dentures?: No  Does patient usually wear dentures?: No     Musculoskeletal: Strength & Muscle Tone: within normal limits Gait & Station: normal, steady Patient leans: N/A  Psychiatric Specialty Exam: Physical Exam Vitals and nursing note reviewed.  HENT:     Head: Normocephalic.  Pulmonary:     Effort: Pulmonary effort is normal.  Musculoskeletal:        General: Normal range of motion.     Cervical back: Normal range of motion.  Neurological:     General: No focal deficit present.     Mental Status: She is alert.  Psychiatric:        Attention and Perception: Attention normal. She does not perceive auditory or visual hallucinations.        Mood and Affect: Mood normal.        Speech: Speech normal.        Behavior: Behavior normal. Behavior is cooperative.        Thought Content: Thought content is not paranoid.        Cognition and Memory: Cognition normal.    Review of Systems  Constitutional: Negative.   HENT:  Negative for sinus pain and sore throat.   Respiratory: Negative.  Negative for shortness of breath.   Cardiovascular:  Negative for chest pain.  Gastrointestinal: Negative.  Negative for constipation, diarrhea, nausea and vomiting.  Endocrine: Negative.   Genitourinary: Negative.   Musculoskeletal: Negative.   Neurological: Negative.    Blood pressure 118/75, pulse 97, temperature 97.8 F (36.6 C), temperature source Oral, height 5\' 6"  (1.676 m),  weight 99.8 kg, last menstrual period 02/18/2021, SpO2 100 %.Body mass index is 35.51 kg/m.  General Appearance:  more disheveled today, casually dressed  Eye Contact:  Good  Speech:  Clear and Coherent and Normal Rate  Volume:  Normal  Mood:   aloof  Affect:   mildly guarded, calm, polite  Thought Process:  Goal Directed but concrete  Orientation:  oriented to self and place and time  Thought Content:  Endorses VH, but denies AH, ideas of reference, or first rank symptoms - guarded on exam  Suicidal Thoughts:  No  Homicidal Thoughts:  No  Memory:  Recent;   Fair  Judgement:  Fair  Insight:  Lacking  Psychomotor Activity:  Normal  Concentration:  Concentration: Fair and Attention Span: Fair  Recall:  [employer] of Knowledge:  Fair  Language:  Good  Akathisia:  Negative  Assets:  Communication Skills Desire for Improvement Resilience Social Support  ADL's:  Intact  Cognition:  WNL  Sleep:  Number of Hours: 5.25   Treatment Plan Summary: Diagnoses / Active Problems: Schizophrenia by hx  PLAN: Safety and Monitoring:  -- Voluntary admission to inpatient psychiatric unit for safety, stabilization and treatment  -- Daily contact with patient to assess and evaluate symptoms and progress in treatment  -- Patient's case to be discussed in multi-disciplinary team meeting  -- Observation Level : q15 minute checks  -- Vital signs:  q12 hours  -- Precautions: suicide  2. Psychiatric Diagnoses and Treatment:   Schizophrenia by hx  -- Increase Risperdal to 2mg  qam and 4mg  po qhs   -- Schedule Cogentin 0.5 mg BID instead of PRN for EPS  -- Continue Risperdal agitation protocol PRN -- Increase Trazodone to 200mg  po qhs scheduled with Melatonin 6mg  po qhs PRN   -- Continue Ambien 10 mg PO at bedtime for sleep  --Initiate Klonopin 0.5 mg PO BID for anxiety/pacing   --  Metabolic profile and EKG monitoring obtained while on an atypical antipsychotic (Lipid Panel: cholesterol 151,  Triglycerides 55, HDL 35, LDL 105; HbgA1c: 6.0; QTc:475ms)   -- Encouraged patient to participate in unit milieu and in scheduled group therapies   -- Short Term Goals: Ability to demonstrate self-control will improve and Ability to identify and develop effective coping behaviors will improve  -- Long Term Goals: Improvement in symptoms so as ready for discharge   3. Medical Issues Being Addressed:   Anemia with low Ferritin  -- Continue Ferrous Sulfate 325mg  daily with Colace 100mg  daily  4. Discharge Planning:   -- Social work and case management to assist with discharge planning and identification of hospital follow-up needs prior to discharge  -- Estimated LOS: 4-5 days  -- Discharge Concerns: Need to establish a safety plan; Medication compliance and effectiveness  -- Discharge Goals: Return home with outpatient referrals for mental health follow-up including medication management/psychotherapy   Ethelene Hal, NP 02/27/2021, 6:00 PM

## 2021-02-27 NOTE — Progress Notes (Signed)
Recreation Therapy Notes  Date: 6.27.22 Time: 1000 Location: 500 Hall Dayroom  Group Topic: Coping Skills  Goal Area(s) Addresses:  Patient will identify positive coping skills. Patient will identify benefit of using coping skills. Patient will identify difference between healthy and unhealthy coping skills.  Intervention: Worksheet, Pencils  Activity: Mind Map.  LRT and patients filled out the first 8 boxes (anxiety, anger, intense fear, hurt, grief, depression, irritable and relationships) together.  Individually, patients were then asked to come up with at least 3 coping skills for each area identified.  The group come back together and LRT would write responses on the board.  Patients could fill in any blank spots they may have had on their sheet.  LRT then handed out a sheet with 100 more coping skills patients could use.  Education: Radiographer, therapeutic, Dentist.   Education Outcome: Acknowledges understanding/In group clarification offered/Needs additional education.   Clinical Observations/Feedback: Pt did not attend group session.    Victorino Sparrow, LRT/CTRS         Victorino Sparrow A 02/27/2021 1:36 PM

## 2021-02-27 NOTE — Progress Notes (Signed)
Progress note  Pt found in bed; compliant with morning medication pass. Pt continues to be bizarre and watchful. Pt also seems paranoid. Pt conversations can be tangential and inappropriate at times. Pt states they are performing hygiene habits regularly. Pt hasn't allowed [place] to wash their clothes which may be the cause of malodor. Pt continues to be reclusive to their room. Pt denies si/hi/ah/vh and verbally agrees to approach staff if these become apparent or before harming themselves/others while at [place].  A: Pt provided support and encouragement. Pt given medication per protocol and standing orders. Q6m safety checks implemented and continued.  R: Pt safe on the unit. Will continue to monitor.

## 2021-02-27 NOTE — Progress Notes (Signed)
Pt did not attend orientation/goals group. 

## 2021-02-27 NOTE — B[facility] Group Notes (Signed)
LCSW Group Therapy Note   Type of Therapy/Topic: Group Therapy: Six Dimensions of Wellness   Participation Level: Did Not Attend   Description of Group: This group will address the concept of wellness and the six concepts of wellness: occupational, physical, social, intellectual, spiritual, and emotional. Patients will be encouraged to process areas in their lives that are out of balance and identify reasons for remaining unbalanced. Patients will be encouraged to explore ways to practice healthy habits daily to attain better physical and mental health outcomes.   Therapeutic Goals: 1. Identify aspects of wellness that they are doing well. 2. Identify aspects of wellness that they would like to improve upon. 3. Identify one action they can take to improve an aspect of wellness in their lives.     Summary of Patient Progress: Pt did not attend despite personal invitation from staff.       Therapeutic Modalities: Cognitive Behavioral Therapy Solution-Focused Therapy Relapse Prevention   Darletta Moll MSW, LCSW Clincal Social Worker  [facility]

## 2021-02-27 NOTE — Tx Team (Signed)
Interdisciplinary Treatment and Diagnostic Plan Update  02/27/2021 Time of Session: 10:00am  Ashley Vega MRN: [id number]  Principal Diagnosis: Schizophrenia [facility])  Secondary Diagnoses: Principal Problem:   Schizophrenia ([place])   Current Medications:  Current Facility-Administered Medications  Medication Dose Route Frequency Provider Last Rate Last Admin   acetaminophen (TYLENOL) tablet 650 mg  650 mg Oral Q6H PRN Sharma Covert, MD       alum & mag hydroxide-simeth (MAALOX/MYLANTA) 200-200-20 MG/5ML suspension 30 mL  30 mL Oral Q4H PRN Sharma Covert, MD       benztropine (COGENTIN) tablet 0.5 mg  0.5 mg Oral BID Ethelene Hal, NP   0.5 mg at 02/27/21 7673   clonazePAM (KLONOPIN) tablet 0.5 mg  0.5 mg Oral BID Ethelene Hal, NP       docusate sodium (COLACE) capsule 100 mg  100 mg Oral Daily Nelda Marseille, Amy E, MD   100 mg at 02/27/21 4193   ferrous sulfate tablet 325 mg  325 mg Oral Q breakfast Harlow Asa, MD   325 mg at 02/27/21 7902   magnesium hydroxide (MILK OF MAGNESIA) suspension 30 mL  30 mL Oral Daily PRN Sharma Covert, MD       melatonin tablet 6 mg  6 mg Oral QHS Nelda Marseille, Amy E, MD   6 mg at 02/26/21 2114   risperiDONE (RISPERDAL M-TABS) disintegrating tablet 2 mg  2 mg Oral Q8H PRN Sharma Covert, MD   2 mg at 02/24/21 1652   risperiDONE (RISPERDAL M-TABS) disintegrating tablet 2 mg  2 mg Oral Daily Nelda Marseille, Amy E, MD   2 mg at 02/27/21 0742   risperiDONE (RISPERDAL M-TABS) disintegrating tablet 4 mg  4 mg Oral QHS Ethelene Hal, NP   4 mg at 02/26/21 2116   traZODone (DESYREL) tablet 200 mg  200 mg Oral QHS Nelda Marseille, Amy E, MD   200 mg at 02/26/21 2115   white petrolatum (VASELINE) gel            zolpidem (AMBIEN) tablet 10 mg  10 mg Oral QHS PRN Ethelene Hal, NP       PTA Medications: Medications Prior to Admission  Medication Sig Dispense Refill Last Dose   acetaminophen (TYLENOL) 325 MG tablet Take 650 mg by mouth  every 6 (six) hours as needed for mild pain or headache.      ibuprofen (ADVIL) 400 MG tablet Take 400 mg by mouth every 6 (six) hours as needed for mild pain or headache.      ABILIFY 10 MG tablet Take 10 mg by mouth daily.      RISPERDAL 1 MG tablet Take 1 mg by mouth at bedtime.       Patient Stressors: Health problems Occupational concerns  Patient Strengths: Ability for insight Communication skills Physical Health Supportive family/friends  Treatment Modalities: Medication Management, Group therapy, Case management,  1 to 1 session with clinician, Psychoeducation, Recreational therapy.   Physician Treatment Plan for Primary Diagnosis: Schizophrenia ([place]) Long Term Goal(s): Improvement in symptoms so as ready for discharge   Short Term Goals: Ability to demonstrate self-control will improve Ability to identify and develop effective coping behaviors will improve  Medication Management: Evaluate patient's response, side effects, and tolerance of medication regimen.  Therapeutic Interventions: 1 to 1 sessions, Unit Group sessions and Medication administration.  Evaluation of Outcomes: Not Met  Physician Treatment Plan for Secondary Diagnosis: Principal Problem:   Schizophrenia ([place])  Long Term Goal(s): Improvement in  symptoms so as ready for discharge   Short Term Goals: Ability to demonstrate self-control will improve Ability to identify and develop effective coping behaviors will improve     Medication Management: Evaluate patient's response, side effects, and tolerance of medication regimen.  Therapeutic Interventions: 1 to 1 sessions, Unit Group sessions and Medication administration.  Evaluation of Outcomes: Not Met   RN Treatment Plan for Primary Diagnosis: Schizophrenia ([place]) Long Term Goal(s): Knowledge of disease and therapeutic regimen to maintain health will improve  Short Term Goals: Ability to remain free from injury will improve, Ability to participate  in decision making will improve, Ability to verbalize feelings will improve, Ability to disclose and discuss suicidal ideas, and Ability to identify and develop effective coping behaviors will improve  Medication Management: RN will administer medications as ordered by provider, will assess and evaluate patient's response and provide education to patient for prescribed medication. RN will report any adverse and/or side effects to prescribing provider.  Therapeutic Interventions: 1 on 1 counseling sessions, Psychoeducation, Medication administration, Evaluate responses to treatment, Monitor vital signs and CBGs as ordered, Perform/monitor CIWA, COWS, AIMS and Fall Risk screenings as ordered, Perform wound care treatments as ordered.  Evaluation of Outcomes: Not Met   LCSW Treatment Plan for Primary Diagnosis: Schizophrenia ([place]) Long Term Goal(s): Safe transition to appropriate next level of care at discharge, Engage patient in therapeutic group addressing interpersonal concerns.  Short Term Goals: Engage patient in aftercare planning with referrals and resources, Increase social support, Increase emotional regulation, Facilitate acceptance of mental health diagnosis and concerns, Identify triggers associated with mental health/substance abuse issues, and Increase skills for wellness and recovery  Therapeutic Interventions: Assess for all discharge needs, 1 to 1 time with Social worker, Explore available resources and support systems, Assess for adequacy in community support network, Educate family and significant other(s) on suicide prevention, Complete Psychosocial Assessment, Interpersonal group therapy.  Evaluation of Outcomes: Not Met   Progress in Treatment: Attending groups: Yes. Participating in groups: Yes. Taking medication as prescribed: Yes. Toleration medication: Yes. Family/Significant other contact made: Yes, individual(s) contacted:  father Patient understands diagnosis:  No. Discussing patient identified problems/goals with staff: Yes. Medical problems stabilized or resolved: Yes. Denies suicidal/homicidal ideation: Yes. Issues/concerns per patient self-inventory: No.   New problem(s) identified: No, Describe:  None   New Short Term/Long Term Goal(s): medication stabilization, elimination of SI thoughts, development of comprehensive mental wellness plan.   Patient Goals:  Did not attend  Discharge Plan or Barriers: Patient is to return home to live with family at discharge. Pt is to follow up with established provider at the [facility].  Reason for Continuation of Hospitalization: Hallucinations Medication stabilization Suicidal ideation  Estimated Length of Stay: 3 to 5 days   Attendees: Patient: Did not attend 02/27/2021   Physician:  02/27/2021  Nursing:  02/27/2021   RN Care Manager: 02/27/2021   Social Worker: Darletta Moll, LCSW 02/27/2021   Recreational Therapist:  02/27/2021   Other:  02/27/2021   Other:  02/27/2021   Other: 02/27/2021     Scribe for Treatment Team: Vassie Moselle, LCSW 02/27/2021 3:32 PM

## 2021-02-27 NOTE — Progress Notes (Signed)
      02/26/21 2115  Psych Admission Type (Psych Patients Only)  Admission Status Voluntary  Psychosocial Assessment  Patient Complaints Anxiety;Depression  Eye Contact Fair  Facial Expression Animated;Anxious;Pensive  Affect Anxious;Preoccupied  Speech Logical/coherent  Interaction Assertive;Needy  Motor Activity Restless  Appearance/Hygiene Improved  Behavior Characteristics Cooperative;Appropriate to situation;Anxious  Mood Pleasant;Anxious  Thought Process  Coherency Concrete thinking  Content Paranoia  Delusions Paranoid  Perception Hallucinations  Hallucination Auditory  Judgment Poor  Confusion None  Danger to Self  Current suicidal ideation? Denies  Danger to Others  Danger to Others None reported or observed

## 2021-02-27 NOTE — Plan of Care (Signed)
  Problem: Activity: Goal: Interest or engagement in activities will improve Outcome: Progressing   Problem: Health Behavior/Discharge Planning: Goal: Compliance with treatment plan for underlying cause of condition will improve Outcome: Progressing   Problem: Physical Regulation: Goal: Ability to maintain clinical measurements within normal limits will improve Outcome: Progressing

## 2021-02-28 MED ORDER — TRAZODONE HCL 100 MG PO TABS
100.0000 mg | ORAL_TABLET | Freq: Every day | ORAL | Status: DC
  Administered 2021-02-28 – 2021-03-02 (×3): 100 mg via ORAL
  Filled 2021-02-28 (×4): qty 1

## 2021-02-28 MED ORDER — OLANZAPINE 15 MG PO TBDP
15.0000 mg | ORAL_TABLET | Freq: Every day | ORAL | Status: DC
  Administered 2021-02-28: 15 mg via ORAL
  Filled 2021-02-28 (×2): qty 1

## 2021-02-28 NOTE — Plan of Care (Signed)
  Problem: Safety: Goal: Periods of time without injury will increase Outcome: Progressing   Problem: Activity: Goal: Will identify at least one activity in which they can participate Outcome: Progressing

## 2021-02-28 NOTE — Progress Notes (Signed)
Pt endorsed AH-same, but overall getting better from the time she came in to the hospital. Pt visible on the unit much of the evening.     02/28/21 2200  Psych Admission Type (Psych Patients Only)  Admission Status Voluntary  Psychosocial Assessment  Patient Complaints Anxiety  Eye Contact Fair  Facial Expression Animated;Anxious;Pensive  Affect Apathetic;Appropriate to circumstance  Speech Logical/coherent  Interaction Assertive  Motor Activity Slow  Appearance/Hygiene Unremarkable  Behavior Characteristics Cooperative;Anxious  Mood Anxious;Pleasant  Thought Process  Coherency Concrete thinking  Content WDL  Delusions None reported or observed  Perception Hallucinations  Hallucination Auditory  Judgment Poor  Confusion None  Danger to Self  Current suicidal ideation? Denies  Self-Injurious Behavior No self-injurious ideation or behavior indicators observed or expressed   Danger to Others  Danger to Others None reported or observed

## 2021-02-28 NOTE — Progress Notes (Signed)
Recreation Therapy Notes  Date: 6.28.22 Time: 1000 Location: [address] Dayroom  Group Topic: Communication  Goal Area(s) Addresses:  Patient will effectively communicate with peers in group.  Patient will verbalize benefit of healthy communication. Patient will verbalize positive effect of healthy communication on post d/c goals.   Behavioral Response: Minimal  Intervention: Geometrical Drawings, Pencils, Blank Paper  Activity: Draw What You Hear.  LRT gives volunteer a picture of geometrical shapes.  The presenter is to describe the picture in as much detail as possible.  The remaining members of the group are to draw the picture how it is described to them.  The members of the group to only ask the presenter to repeat themselves, they were not to ask any detailed questions.  When the presenter is finished, they turn the picture around to compare it to the drawings of the group members.  Education: Communication, Discharge Planning  Education Outcome: Acknowledges understanding/In group clarification offered/Needs additional education.   Clinical Observations/Feedback: Pt was attentive but became distracted as group went on.  Pt began making random comments that had nothing to do with the activity.  Besides that, pt was appropriate during group session.     Victorino Sparrow, LRT/CTRS        Victorino Sparrow A 02/28/2021 11:43 AM

## 2021-02-28 NOTE — Progress Notes (Signed)
The focus of this group is to help patients review their daily goal of treatment and discuss progress on daily workbooks. Pt attended the evening group session and responded to all discussion prompts from the [place]. Pt shared that today was a good day on the unit, the highlight of which was "waking up on the right side of the bed," which she attributes to her generally feeling better.  Pt shared that her goal this week involved hopefully being discharged, which she felt she was ready for.  Pt rated her day a 9 out of 10 and her affect was appropriate.

## 2021-02-28 NOTE — Progress Notes (Signed)
     02/27/21 2110  Psych Admission Type (Psych Patients Only)  Admission Status Voluntary  Psychosocial Assessment  Patient Complaints Anxiety;Suspiciousness;Depression  Eye Contact Fair  Facial Expression Anxious;Pensive  Affect Anxious;Preoccupied  Speech Logical/coherent  Interaction Cautious  Motor Activity Restless  Appearance/Hygiene Unremarkable  Behavior Characteristics Cooperative;Anxious;Guarded  Mood Anxious;Preoccupied;Pleasant  Thought Pension scheme manager thinking  Content Paranoia  Delusions Paranoid  Perception Hallucinations  Hallucination Auditory  Judgment Poor  Confusion None  Danger to Self  Current suicidal ideation? Passive  Self-Injurious Behavior No self-injurious ideation or behavior indicators observed or expressed   Agreement Not to Harm Self Yes  Description of Agreement verbal contract for safety  Danger to Others  Danger to Others None reported or observed

## 2021-02-28 NOTE — Progress Notes (Signed)
Pt did not attend orientation/goals group. 

## 2021-02-28 NOTE — Progress Notes (Signed)
[facility] MD Progress Note  02/28/2021 5:11 PM Ashley Vega  MRN:  [id number] Subjective: Patient is a 30 year old female with a reported past psychiatric history significant for schizophrenia who originally presented to the [facility] emergency department on 02/19/2021 with auditory hallucinations.  Objective: Patient is seen and examined.  Patient is a 30 year old female with the above-stated past psychiatric history who is seen in follow-up.  I am familiar with this patient in the fact that I admitted her.  I started her on Risperdal.  Since her admission has been titrated upwards.  She continues to have problems with sleep.  She still ruminates about "I came here to get the long-acting Abilify injection".  Review of the electronic medical record before I left showed that she had reported side effects to the Abilify injection in the past.  She also had received either the long-acting paliperidone injection or the long-acting Risperdal injection in the past.  Unfortunately she is only slightly better from when I last saw her.  She still does not sleep well, and is now on clonazepam, Ambien and trazodone for sleep.  Her blood pressure stable, and heart rates between 89 and 102.  She has a low-grade temperature today at 99.1.  Pulse oximetry on room air is 100%.  Even with all the above medication she still only slept 5.5 hours last night.  Review of her laboratories showed no new labs in 6/21.  Lipid panel was normal.  Her anemia panel showed an elevated TIBC, low saturation at 8, and a low ferritin at 8.  B12 and folate were both normal.  Her retake count was normal.  Hemoglobin A1c was 6.0.  TSH was normal.  Drug screen was negative.  Beta-hCG was negative.  EKG showed a normal sinus rhythm with a mildly prolonged QTC at 485.  Principal Problem: Schizophrenia ([place]) Diagnosis: Principal Problem:   Schizophrenia ([place])  Total Time spent with patient: 20 minutes  Past Psychiatric History: See  admission H&P  Past Medical History:  Past Medical History:  Diagnosis Date   Fibroids    No pertinent past medical history     Past Surgical History:  Procedure Laterality Date   NO PAST SURGERIES     Family History:  Family History  Problem Relation Age of Onset   Cancer Maternal Grandmother    Hypertension Maternal Grandfather    Kidney disease Maternal Grandfather    Other Neg Hx    Family Psychiatric  History: See admission H&P Social History:  Social History   Substance and Sexual Activity  Alcohol Use No     Social History   Substance and Sexual Activity  Drug Use No    Social History   Socioeconomic History   Marital status: Single    Spouse name: Not on file   Number of children: Not on file   Years of education: Not on file   Highest education level: Not on file  Occupational History   Not on file  Tobacco Use   Smoking status: Former    Pack years: 0.00    Types: Cigars   Smokeless tobacco: Never  Vaping Use   Vaping Use: Never used  Substance and Sexual Activity   Alcohol use: No   Drug use: No   Sexual activity: Yes    Birth control/protection: None  Other Topics Concern   Not on file  Social History Narrative   Not on file   Social Determinants of Health   Financial  Resource Strain: Not on file  Food Insecurity: Not on file  Transportation Needs: Not on file  Physical Activity: Not on file  Stress: Not on file  Social Connections: Not on file   Additional Social History:                         Sleep: Poor  Appetite:  Good  Current Medications: Current Facility-Administered Medications  Medication Dose Route Frequency Provider Last Rate Last Admin   acetaminophen (TYLENOL) tablet 650 mg  650 mg Oral Q6H PRN Sharma Covert, MD       alum & mag hydroxide-simeth (MAALOX/MYLANTA) 200-200-20 MG/5ML suspension 30 mL  30 mL Oral Q4H PRN Sharma Covert, MD       clonazePAM Bobbye [place]) tablet 0.5 mg  0.5 mg Oral BID  Ethelene Hal, NP   0.5 mg at 02/28/21 0815   docusate sodium (COLACE) capsule 100 mg  100 mg Oral Daily Nelda Marseille, Amy E, MD   100 mg at 02/28/21 0815   ferrous sulfate tablet 325 mg  325 mg Oral Q breakfast Viann Fish E, MD   325 mg at 02/28/21 0816   magnesium hydroxide (MILK OF MAGNESIA) suspension 30 mL  30 mL Oral Daily PRN Sharma Covert, MD       OLANZapine zydis (ZYPREXA) disintegrating tablet 15 mg  15 mg Oral QHS Sharma Covert, MD       traZODone (DESYREL) tablet 100 mg  100 mg Oral QHS Sharma Covert, MD       zolpidem [facility]) tablet 10 mg  10 mg Oral QHS PRN Ethelene Hal, NP   10 mg at 02/27/21 2318    Lab Results: No results found for this or any previous visit (from the past 66 hour(s)).  Blood Alcohol level:  Lab Results  Component Value Date   ETH <10 02/20/2021   ETH <10 93/79/0240    Metabolic Disorder Labs: Lab Results  Component Value Date   HGBA1C 6.0 (H) 02/23/2021   MPG 125.5 02/23/2021   No results found for: PROLACTIN Lab Results  Component Value Date   CHOL 151 02/21/2021   TRIG 55 02/21/2021   HDL 35 (L) 02/21/2021   CHOLHDL 4.3 02/21/2021   VLDL 11 02/21/2021   LDLCALC 105 (H) 02/21/2021    Physical Findings: AIMS: Facial and Oral Movements Muscles of Facial Expression: None, normal Lips and Perioral Area: None, normal Jaw: None, normal Tongue: None, normal,Extremity Movements Upper (arms, wrists, hands, fingers): None, normal Lower (legs, knees, ankles, toes): None, normal, Trunk Movements Neck, shoulders, hips: None, normal, Overall Severity Severity of abnormal movements (highest score from questions above): None, normal Incapacitation due to abnormal movements: None, normal Patient's awareness of abnormal movements (rate only patient's report): No Awareness, Dental Status Current problems with teeth and/or dentures?: No Does patient usually wear dentures?: No  CIWA:    COWS:      Musculoskeletal: Strength & Muscle Tone: within normal limits Gait & Station: normal Patient leans: N/A  Psychiatric Specialty Exam:  Presentation  General Appearance: Fairly Groomed  Eye Contact:Fair  Speech:Normal Rate  Speech Volume:Decreased  Handedness:Right   Mood and Affect  Mood:Dysphoric  Affect:Congruent   Thought Process  Thought Processes:Goal Directed  Descriptions of Associations:Circumstantial  Orientation:Full (Time, Place and Person)  Thought Content:Delusions; Paranoid Ideation  History of Schizophrenia/Schizoaffective disorder:Yes  Duration of Psychotic Symptoms:Greater than six months  Hallucinations:No data recorded Ideas of Reference:Delusions; Paranoia  Suicidal Thoughts:No data recorded Homicidal Thoughts:No data recorded  Sensorium  Memory:Immediate Poor; Recent Poor; Remote Poor  Judgment:Impaired  Insight:Lacking   Executive Functions  Concentration:Fair  Attention Span:Fair  Recall:Poor  Fund of Knowledge:Fair  Language:Fair   Psychomotor Activity  Psychomotor Activity: No data recorded  Assets  Assets:Desire for Improvement; Resilience; Social Support; Housing   Sleep  Sleep: No data recorded   Physical Exam: Physical Exam Vitals and nursing note reviewed.  Constitutional:      Appearance: Normal appearance.  HENT:     Head: Normocephalic and atraumatic.  Pulmonary:     Effort: Pulmonary effort is normal.  Neurological:     General: No focal deficit present.     Mental Status: She is alert and oriented to person, place, and time.   Review of Systems  All other systems reviewed and are negative. Blood pressure 132/79, pulse (!) 102, temperature 99.1 F (37.3 C), temperature source Oral, height 5\' 6"  (1.676 m), weight 99.8 kg, last menstrual period 02/18/2021, SpO2 100 %. Body mass index is 35.51 kg/m.   Treatment Plan Summary: Daily contact with patient to assess and evaluate symptoms and  progress in treatment, Medication management, and Plan patient is seen and examined.  Patient is a 30 year old female with the above-stated past psychiatric history who is seen in follow-up.  Diagnosis: 1.  Schizophrenia. 2.  Anemia. 3.  Insomnia.  Pertinent findings on examination today: 1.  There has been some improvement in her paranoia and delusional thinking, but even with 2 mg of Risperdal during the day and 4 mg p.o. nightly she is still not sleeping well.  Plan: 1.  Stop Risperdal. 2.  Start Zyprexa Zydis 15 mg p.o. nightly for psychosis and sleep. 3.  For now continue clonazepam 0.5 mg p.o. twice daily for anxiety and sedation. 4.  Continue Colace 100 mg p.o. daily for constipation. 5.  Continue ferrous sulfate 325 mg p.o. daily with breakfast for anemia. 6.  Continue trazodone 100 mg p.o. nightly for insomnia for now. 7.  Continue Ambien 10 mg p.o. nightly as needed for insomnia for now. 8.  Monitor EKG results for QTC prolongation. 9.  I am aware that the patient was unhappy about gaining weight on medications in the past, but weight gain with Seroquel or Zyprexa would be essentially the same. 10.  Disposition planning-in progress.  Sharma Covert, MD 02/28/2021, 5:11 PM

## 2021-02-28 NOTE — Plan of Care (Signed)
  Problem: Coping: Goal: Ability to demonstrate self-control will improve Outcome: Progressing   Problem: Self-Concept: Goal: Level of anxiety will decrease Outcome: Not Progressing   Problem: Education: Goal: Mental status will improve Outcome: Not Progressing

## 2021-02-28 NOTE — Progress Notes (Signed)
Progress note    02/28/21 0815  Psych Admission Type (Psych Patients Only)  Admission Status Voluntary  Psychosocial Assessment  Patient Complaints Anxiety  Eye Contact Fair  Facial Expression Animated;Anxious;Pensive  Affect Apathetic;Appropriate to circumstance  Speech Logical/coherent  Interaction Assertive  Motor Activity Slow  Appearance/Hygiene Unremarkable  Behavior Characteristics Cooperative;Appropriate to situation;Anxious  Mood Anxious;Pleasant  Thought Process  Coherency Concrete thinking  Content WDL  Delusions None reported or observed  Perception WDL  Hallucination None reported or observed  Judgment Poor  Confusion None  Danger to Self  Current suicidal ideation? Denies  Self-Injurious Behavior No self-injurious ideation or behavior indicators observed or expressed   Danger to Others  Danger to Others None reported or observed

## 2021-03-01 MED ORDER — OLANZAPINE 5 MG PO TBDP
5.0000 mg | ORAL_TABLET | Freq: Every day | ORAL | Status: DC
  Administered 2021-03-01: 5 mg via ORAL
  Filled 2021-03-01 (×3): qty 1

## 2021-03-01 MED ORDER — OLANZAPINE 10 MG PO TBDP
20.0000 mg | ORAL_TABLET | Freq: Every day | ORAL | Status: DC
  Administered 2021-03-01 – 2021-03-02 (×2): 20 mg via ORAL
  Filled 2021-03-01 (×3): qty 2

## 2021-03-01 NOTE — Progress Notes (Signed)
Progress note  Pt continues to be disorganized, tangential, and make confabulations. Pt has been more reclusive to their room. Pt's hygiene has improved.     03/01/21 0930  Psych Admission Type (Psych Patients Only)  Admission Status Voluntary  Psychosocial Assessment  Patient Complaints Anxiety;Restlessness  Eye Contact Fair  Facial Expression Animated;Anxious;Pensive  Affect Anxious;Preoccupied  Speech Logical/coherent  Interaction Assertive  Motor Activity Slow  Appearance/Hygiene Unremarkable  Behavior Characteristics Cooperative;Anxious  Mood Anxious;Preoccupied;Pleasant  Thought Horticulturist, commercial of ideas  Content Confabulation  Delusions Paranoid  Perception Hallucinations  Hallucination Auditory  Judgment Poor  Confusion None  Danger to Self  Current suicidal ideation? Denies  Self-Injurious Behavior No self-injurious ideation or behavior indicators observed or expressed   Danger to Others  Danger to Others None reported or observed

## 2021-03-01 NOTE — Progress Notes (Signed)
[facility] MD Progress Note  03/01/2021 3:44 PM Ashley Vega  MRN:  [id number] Subjective: Patient is a 30 year old female with a reported past psychiatric history significant for schizophrenia who originally presented to the [facility] emergency department on 02/19/2021 with auditory hallucinations.  Objective: Patient is seen and examined.  Patient is a 30 year old female with the above-stated past psychiatric history who is seen in follow-up.  The Risperdal was stopped yesterday for lack of efficacy and she was started on Zyprexa Zydis.  She stated she feels much more relaxed today.  She denied any auditory or visual hallucinations.  She stated that she was not thinking about hurting herself.  She continues to ruminate about having the Abilify injection or the Risperdal injection.  I reminded her that in the past the mental [place] and said that she did not tolerate the Abilify long-acting injectable medication, and is well that the oral Risperdal did not work, so I suspect that the injectable Risperdal will not work as well.  She is mildly sedated right now, and we discussed the possibility of decreasing the dosage.  Her vital signs are stable, she is afebrile.  Her sleep did improve to 5.25 hours last night.  No new laboratories.  Principal Problem: Schizophrenia ([place]) Diagnosis: Principal Problem:   Schizophrenia ([place])  Total Time spent with patient: 20 minutes  Past Psychiatric History: See admission H&P  Past Medical History:  Past Medical History:  Diagnosis Date   Fibroids    No pertinent past medical history     Past Surgical History:  Procedure Laterality Date   NO PAST SURGERIES     Family History:  Family History  Problem Relation Age of Onset   Cancer Maternal Grandmother    Hypertension Maternal Grandfather    Kidney disease Maternal Grandfather    Other Neg Hx    Family Psychiatric  History: See admission H&P Social History:  Social History    Substance and Sexual Activity  Alcohol Use No     Social History   Substance and Sexual Activity  Drug Use No    Social History   Socioeconomic History   Marital status: Single    Spouse name: Not on file   Number of children: Not on file   Years of education: Not on file   Highest education level: Not on file  Occupational History   Not on file  Tobacco Use   Smoking status: Former    Pack years: 0.00    Types: Cigars   Smokeless tobacco: Never  Vaping Use   Vaping Use: Never used  Substance and Sexual Activity   Alcohol use: No   Drug use: No   Sexual activity: Yes    Birth control/protection: None  Other Topics Concern   Not on file  Social History Narrative   Not on file   Social Determinants of Health   Financial Resource Strain: Not on file  Food Insecurity: Not on file  Transportation Needs: Not on file  Physical Activity: Not on file  Stress: Not on file  Social Connections: Not on file   Additional Social History:                         Sleep: Fair  Appetite:  Fair  Current Medications: Current Facility-Administered Medications  Medication Dose Route Frequency Provider Last Rate Last Admin   acetaminophen (TYLENOL) tablet 650 mg  650 mg Oral Q6H PRN Sharma Covert,  MD       alum & mag hydroxide-simeth (MAALOX/MYLANTA) 200-200-20 MG/5ML suspension 30 mL  30 mL Oral Q4H PRN Sharma Covert, MD       clonazePAM Bobbye [place]) tablet 0.5 mg  0.5 mg Oral BID Ethelene Hal, NP   0.5 mg at 03/01/21 4259   docusate sodium (COLACE) capsule 100 mg  100 mg Oral Daily Nelda Marseille, Amy E, MD   100 mg at 03/01/21 5638   ferrous sulfate tablet 325 mg  325 mg Oral Q breakfast Harlow Asa, MD   325 mg at 03/01/21 7564   magnesium hydroxide (MILK OF MAGNESIA) suspension 30 mL  30 mL Oral Daily PRN Sharma Covert, MD       OLANZapine zydis (ZYPREXA) disintegrating tablet 20 mg  20 mg Oral QHS Sharma Covert, MD       OLANZapine  zydis (ZYPREXA) disintegrating tablet 5 mg  5 mg Oral Daily Sharma Covert, MD   5 mg at 03/01/21 0930   traZODone (DESYREL) tablet 100 mg  100 mg Oral QHS Sharma Covert, MD   100 mg at 02/28/21 2102   zolpidem (AMBIEN) tablet 10 mg  10 mg Oral QHS PRN Ethelene Hal, NP   10 mg at 02/27/21 2318    Lab Results: No results found for this or any previous visit (from the past 30 hour(s)).  Blood Alcohol level:  Lab Results  Component Value Date   ETH <10 02/20/2021   ETH <10 33/29/5188    Metabolic Disorder Labs: Lab Results  Component Value Date   HGBA1C 6.0 (H) 02/23/2021   MPG 125.5 02/23/2021   No results found for: PROLACTIN Lab Results  Component Value Date   CHOL 151 02/21/2021   TRIG 55 02/21/2021   HDL 35 (L) 02/21/2021   CHOLHDL 4.3 02/21/2021   VLDL 11 02/21/2021   LDLCALC 105 (H) 02/21/2021    Physical Findings: AIMS: Facial and Oral Movements Muscles of Facial Expression: None, normal Lips and Perioral Area: None, normal Jaw: None, normal Tongue: None, normal,Extremity Movements Upper (arms, wrists, hands, fingers): None, normal Lower (legs, knees, ankles, toes): None, normal, Trunk Movements Neck, shoulders, hips: None, normal, Overall Severity Severity of abnormal movements (highest score from questions above): None, normal Incapacitation due to abnormal movements: None, normal Patient's awareness of abnormal movements (rate only patient's report): No Awareness, Dental Status Current problems with teeth and/or dentures?: No Does patient usually wear dentures?: No  CIWA:    COWS:     Musculoskeletal: Strength & Muscle Tone: within normal limits Gait & Station: normal Patient leans: N/A  Psychiatric Specialty Exam:  Presentation  General Appearance: Fairly Groomed  Eye Contact:Fair  Speech:Normal Rate  Speech Volume:Decreased  Handedness:Right   Mood and Affect  Mood:Dysphoric  Affect:Congruent   Thought Process   Thought Processes:Goal Directed  Descriptions of Associations:Circumstantial  Orientation:Full (Time, Place and Person)  Thought Content:Delusions; Paranoid Ideation  History of Schizophrenia/Schizoaffective disorder:Yes  Duration of Psychotic Symptoms:Greater than six months  Hallucinations:No data recorded Ideas of Reference:Delusions; Paranoia  Suicidal Thoughts:No data recorded Homicidal Thoughts:No data recorded  Sensorium  Memory:Immediate Poor; Recent Poor; Remote Poor  Judgment:Impaired  Insight:Lacking   Executive Functions  Concentration:Fair  Attention Span:Fair  Recall:Poor  Fund of Knowledge:Fair  Language:Fair   Psychomotor Activity  Psychomotor Activity: No data recorded  Assets  Assets:Desire for Improvement; Resilience; Social Support; Housing   Sleep  Sleep: No data recorded   Physical Exam: Physical Exam Vitals  and nursing note reviewed.  HENT:     Head: Normocephalic and atraumatic.  Pulmonary:     Effort: Pulmonary effort is normal.  Neurological:     General: No focal deficit present.     Mental Status: She is alert and oriented to person, place, and time.   Review of Systems  All other systems reviewed and are negative. Blood pressure 129/83, pulse (!) 106, temperature 98.7 F (37.1 C), temperature source Oral, resp. rate 18, height 5\' 6"  (1.676 m), weight 99.8 kg, last menstrual period 02/18/2021, SpO2 100 %. Body mass index is 35.51 kg/m.   Treatment Plan Summary: Daily contact with patient to assess and evaluate symptoms and progress in treatment, Medication management, and Plan patient is seen and examined.  Patient is a 30 year old female with the above-stated past psychiatric history who is seen in follow-up.  Diagnosis: 1.  Schizophrenia 2.  anemia 3.  Insomnia  Pertinent findings on examination today: 1.  Auditory and visual hallucinations have basically resolved. 2.  Decreased paranoia and delusional  thinking. 3.  Some improvement in sleep.  Plan: 1.  Change Zyprexa to 20 mg p.o. nightly but stop the 5 mg p.o. daily dosage. 2.  Continue clonazepam 0.5 mg p.o. twice daily but changed to as needed for anxiety and sedation. 3.  Continue Colace 100 mg p.o. daily for constipation. 4.  Continue ferrous sulfate 325 mg p.o. daily with breakfast for anemia. 5.  Continue trazodone 100 mg p.o. nightly for insomnia for now. 6.  Continue Ambien 10 mg p.o. nightly as needed for insomnia for now. 7.  Monitor EKG results for QTC prolongation. 8.  Disposition planning-in progress.  Sharma Covert, MD 03/01/2021, 3:44 PM

## 2021-03-01 NOTE — [facility] Group Notes (Signed)
LCSW Group Therapy Notes  Type of Therapy and Topic: Group Therapy: Healthy Vs. Unhealthy Coping Strategies  Date and Time: 03/01/2021 at 1:00pm  Participation Level: [facility] PARTICIPATION LEVEL: Did Not Attend  Description of Group:  In this group, patients will be encouraged to explore their healthy and unhealthy coping strategics. Coping strategies are actions that we take to deal with stress, problems, or uncomfortable emotions in our daily lives. Each patient will be challenged to read some scenarios and discuss the unhealthy and healthy coping strategies within those scenarios. Also, each patient will be challenged to describe current healthy and unhealthy strategies that they use in their own lives and discuss the outcomes and barriers to those strategies. This group will be process-oriented, with patients participating in exploration of their own experiences as well as giving and receiving support and challenge from other group members.  Therapeutic Goals: Patient will identify personal healthy and unhealthy coping strategies. Patient will identify healthy and unhealthy coping strategies, in others, through scenarios.  Patient will identify expected outcomes of healthy and unhealthy coping strategies. Patient will identify barriers to using healthy coping strategies.   Summary of Patient Progress: Pt was invited, however chose not to attend.    Therapeutic Modalities:  Cognitive Behavioral Therapy Solution Focused Therapy Motivational Interviewing   Tish Begin MSW, LCSW Clincal Social Worker  [facility]   

## 2021-03-01 NOTE — Progress Notes (Signed)
Adult Psychoeducational Group Note  Date:  03/01/2021 Time:  11:08 PM  Group Topic/Focus:  Wrap-Up Group:   The focus of this group is to help patients review their daily goal of treatment and discuss progress on daily workbooks.  Participation Level:  Minimal  Participation Quality:  Appropriate  Affect:  Appropriate  Cognitive:  Oriented  Insight: Limited  Engagement in Group:  Engaged  Modes of Intervention:  Education  Additional Comments:  Patient attended and participated in group tonight. She reports having a good day she slept, took her medication, and talk to her doctors and nurses.  Ashley Vega [facility] 03/01/2021, 11:08 PM

## 2021-03-01 NOTE — Progress Notes (Signed)
Recreation Therapy Notes  Date: 6.29.22 Time: 1000 Location: 500 Hall Dayroom  Group Topic: Wellness  Goal Area(s) Addresses:  Patient will define components of whole wellness. Patient will verbalize benefit of whole wellness.  Intervention: Music  Activity: LRT introduced the activity to patients emphasing the importance of physical health.  Patients stood in a circle and they each were given the opportunity to lead the group in an exercise of their choosing.  Patients were also encouraged to take breaks as needed and to get water if needed. At completion of activity, LRT explained how the three (mental, physical and emotional) elements of wellness worked together.  Patients were also encouraged to take time for themselves at that it was ok to say no sometimes.  Education: Wellness, Dentist.   Education Outcome: Acknowledges education/In group clarification offered/Needs additional education.   Clinical Observations/Feedback: Pt did not attend group session.     Victorino Sparrow, LRT/CTRS        Victorino Sparrow A 03/01/2021 12:13 PM

## 2021-03-01 NOTE — Plan of Care (Signed)
  Problem: Coping: Goal: Ability to interact with others will improve Outcome: Progressing   Problem: Self-Concept: Goal: Will verbalize positive feelings about self Outcome: Progressing   Problem: Education: Goal: Will be free of psychotic symptoms Outcome: Progressing

## 2021-03-01 NOTE — Progress Notes (Signed)
   03/01/21 0500  Sleep  Number of Hours 5.25

## 2021-03-01 NOTE — Progress Notes (Signed)
Pt visible on the unit much of the evening. Pt continues to request what she sees other people receive, pt requested a salad due to someone else getting one earlier. Pt continues to be very paranoid at times.    03/01/21 2100  Psych Admission Type (Psych Patients Only)  Admission Status Voluntary  Psychosocial Assessment  Patient Complaints Worrying;Suspiciousness;Anxiety  Eye Contact Fair  Facial Expression Animated;Anxious;Pensive  Affect Anxious;Preoccupied  Speech Logical/coherent  Interaction Assertive  Motor Activity Slow  Appearance/Hygiene Unremarkable  Behavior Characteristics Cooperative;Restless;Anxious  Mood Anxious;Suspicious;Preoccupied  Thought Chief Executive Officer  Delusions Paranoid  Perception Hallucinations  Hallucination Auditory  Judgment Poor  Confusion None  Danger to Self  Current suicidal ideation? Denies  Self-Injurious Behavior No self-injurious ideation or behavior indicators observed or expressed   Danger to Others  Danger to Others None reported or observed

## 2021-03-02 MED ORDER — AMLODIPINE BESYLATE 5 MG PO TABS
5.0000 mg | ORAL_TABLET | Freq: Every day | ORAL | Status: DC
  Administered 2021-03-02 – 2021-03-03 (×2): 5 mg via ORAL
  Filled 2021-03-02 (×2): qty 1

## 2021-03-02 MED ORDER — AMLODIPINE BESYLATE 5 MG PO TABS
ORAL_TABLET | ORAL | Status: AC
  Filled 2021-03-02: qty 1

## 2021-03-02 MED ORDER — CLONAZEPAM 0.5 MG PO TABS: 0.5000 mg | ORAL_TABLET | Freq: Two times a day (BID) | ORAL | Status: DC | PRN

## 2021-03-02 NOTE — BHH Group Notes (Signed)
Occupational Therapy Group Note Date: 03/02/2021 Group Topic/Focus: Coping Skills  Group Description: Group encouraged increased social engagement and participation through discussion/activity focused on brain fitness. Patients were provided education on various brain fitness activities/strategies, with explanation provided on the qualifying factors including: one, that is has to be challenging/hard and two, it has to be something that you do not do every day. Patients engaged actively during group session in various brain fitness activities to increase attention, concentration, and problem-solving skills. Discussion followed with a focus on identifying the benefits of brain fitness activities as use for adaptive coping strategies and distraction.    Therapeutic Goal(s): Identify benefit(s) of brain fitness activities as use for adaptive coping and healthy distraction. Identify specific brain fitness activities to engage in as use for adaptive coping and healthy distraction.  Participation Level: OT Group cancelled due to power outage - inability to print out necessary materials. Group supplemented with leisure activities - patients provided with cards and various board games.    Plan: Continue to engage patient in OT groups 2 - 3x/week.  03/02/2021  Aleaha Fickling, MOT, OTR/L 

## 2021-03-02 NOTE — BHH Group Notes (Signed)
Adult Psychoeducational Group Note  Date:  03/02/2021 Time:  9:26 PM  Group Topic/Focus:  Healthy Communication:   The focus of this group is to discuss communication, barriers to communication, as well as healthy ways to communicate with others. Managing Feelings:   The focus of this group is to identify what feelings patients have difficulty handling and develop a plan to handle them in a healthier way upon discharge.  Participation Level:  Minimal  Participation Quality:  Appropriate  Affect:  Appropriate  Cognitive:  Appropriate  Insight: Improving  Engagement in Group:  Improving  Modes of Intervention:  Discussion  Additional Comments  Dalene Carrow 03/02/2021, 9:26 PM

## 2021-03-02 NOTE — Progress Notes (Signed)
Recreation Therapy Notes  Date: 6.30.22 Time: 1000 Location: 500 Hall Dayroom  Group Topic: Self-Esteem  Goal Area(s) Addresses:  Patient will successfully identify positive attributes about themselves.  Patient will successfully identify benefit of improved self-esteem.   Intervention: [employer], Colored Pencils  Activity: What Makes Me.  Patients were given a blank outline of a house.  The house was broken down into different sections.  Each section was labeled for a specific purpose:  Foundation: Values that govern your life Basement: Behaviors you're trying to gain control of Door: Things you hide from others 1st Floor: Emotions you want to experience more 2nd Floor: Things that make you happy Attic: List/Draw what "life worth living" looks like to you Roof: People who protect you Yard Sign: Things you're proud of and want others to see Chimney: Challenging emotions and triggers Smoke: How you blow off steam Sunshine: What brings you joy  Education:  Self-Esteem, Discharge Planning  Education Outcome: Acknowledges education/In group clarification offered/Needs additional education  Clinical Observations/Feedback: Pt did not attend group session.     Victorino Sparrow, LRT/CTRS        Victorino Sparrow A 03/02/2021 12:34 PM

## 2021-03-02 NOTE — Progress Notes (Signed)
   03/02/21 0500  Sleep  Number of Hours 7.5

## 2021-03-02 NOTE — Progress Notes (Signed)
Progress note  Pt continues to improve. Pt seems less bizarre and their conversations are more logical. Pt is pleasant. Pt denies si/hi/ah/vh and verbally agrees to approach staff if these become apparent or before harming themselves/others while at [place].  A: Pt provided support and encouragement. Pt given medication per protocol and standing orders. Q72m safety checks implemented and continued.  R: Pt safe on the unit. Will continue to monitor.    03/02/21 0930  Psych Admission Type (Psych Patients Only)  Admission Status Voluntary  Psychosocial Assessment  Patient Complaints Anxiety  Eye Contact Fair  Facial Expression Animated  Affect Anxious;Appropriate to circumstance  Speech Logical/coherent  Interaction Assertive  Appearance/Hygiene Unremarkable  Behavior Characteristics Cooperative;Appropriate to situation  Mood Pleasant  Thought Process  Coherency WDL  Content WDL  Delusions None reported or observed  Perception WDL  Hallucination None reported or observed  Judgment WDL  Confusion None  Danger to Self  Current suicidal ideation? Denies  Self-Injurious Behavior No self-injurious ideation or behavior indicators observed or expressed   Danger to Others  Danger to Others None reported or observed

## 2021-03-02 NOTE — Plan of Care (Signed)
  Problem: Coping: Goal: Demonstration of participation in decision-making regarding own care will improve Outcome: Progressing Goal: Ability to use eye contact when communicating with others will improve Outcome: Progressing

## 2021-03-02 NOTE — Progress Notes (Signed)
   03/02/21 2020  Psych Admission Type (Psych Patients Only)  Admission Status Voluntary  Psychosocial Assessment  Patient Complaints None  Eye Contact Fair  Facial Expression Animated  Affect Anxious;Appropriate to circumstance  Speech Logical/coherent  Interaction Assertive  Motor Activity Slow  Appearance/Hygiene Unremarkable  Behavior Characteristics Appropriate to situation  Mood Pleasant  Thought Process  Coherency WDL  Content WDL  Delusions None reported or observed  Perception WDL  Hallucination None reported or observed  Judgment WDL  Confusion None  Danger to Self  Current suicidal ideation? Denies  Self-Injurious Behavior No self-injurious ideation or behavior indicators observed or expressed   Agreement Not to Harm Self Yes  Description of Agreement verbal contract for safety  Danger to Others  Danger to Others None reported or observed

## 2021-03-02 NOTE — Progress Notes (Signed)
Pt did not attend orientation/goals group. 

## 2021-03-02 NOTE — Progress Notes (Addendum)
[facility] MD Progress Note  03/02/2021 3:01 PM Ashley Vega  MRN:  [id number] Subjective:  Patient is a 30 year old female with a reported past psychiatric history significant for schizophrenia who originally presented to the [facility] emergency department on 02/19/2021 with auditory hallucinations.  Objective: Patient is seen and examined.  Patient is a 30 year old female with the above-stated past psychiatric history is seen in follow-up.  She has continued to improve with Zyprexa versus the Risperdal.  She stated that the voices were still present, but she could not understand what they were saying.  She denied any suicidal or homicidal ideation.  No other psychosis.  She was wondering when she would be able to go home.  She asked me about the possibility of going to a group home after discharge, and when I discussed this with social work the patient stated that the patient did not have resources for that.  Social work also informed me that the father was anticipating that after discharge that she would return to his home.  She did have some degree of paranoia.  She was talking about wanting to move from her apartment because "everyone knows that I live there".  Her blood pressure still is elevated.  Today it is 158/112, and repeat was 154/85.  She is afebrile.  She did sleep 7.5 hours last night.  No new laboratories. Principal Problem: Schizophrenia ([place]) Diagnosis: Principal Problem:   Schizophrenia ([place])  Total Time spent with patient: 20 minutes  Past Psychiatric History: See admission H&P  Past Medical History:  Past Medical History:  Diagnosis Date   Fibroids    No pertinent past medical history     Past Surgical History:  Procedure Laterality Date   NO PAST SURGERIES     Family History:  Family History  Problem Relation Age of Onset   Cancer Maternal Grandmother    Hypertension Maternal Grandfather    Kidney disease Maternal Grandfather    Other Neg Hx    Family  Psychiatric  History: See admission H&P Social History:  Social History   Substance and Sexual Activity  Alcohol Use No     Social History   Substance and Sexual Activity  Drug Use No    Social History   Socioeconomic History   Marital status: Single    Spouse name: Not on file   Number of children: Not on file   Years of education: Not on file   Highest education level: Not on file  Occupational History   Not on file  Tobacco Use   Smoking status: Former    Pack years: 0.00    Types: Cigars   Smokeless tobacco: Never  Vaping Use   Vaping Use: Never used  Substance and Sexual Activity   Alcohol use: No   Drug use: No   Sexual activity: Yes    Birth control/protection: None  Other Topics Concern   Not on file  Social History Narrative   Not on file   Social Determinants of Health   Financial Resource Strain: Not on file  Food Insecurity: Not on file  Transportation Needs: Not on file  Physical Activity: Not on file  Stress: Not on file  Social Connections: Not on file   Additional Social History:                         Sleep: Good  Appetite:  Good  Current Medications: Current Facility-Administered Medications  Medication Dose Route  Frequency Provider Last Rate Last Admin   acetaminophen (TYLENOL) tablet 650 mg  650 mg Oral Q6H PRN Sharma Covert, MD       alum & mag hydroxide-simeth (MAALOX/MYLANTA) 200-200-20 MG/5ML suspension 30 mL  30 mL Oral Q4H PRN Sharma Covert, MD       amLODipine (NORVASC) tablet 5 mg  5 mg Oral Daily Sharma Covert, MD   5 mg at 03/02/21 1430   clonazePAM (KLONOPIN) tablet 0.5 mg  0.5 mg Oral BID PRN Sharma Covert, MD       docusate sodium (COLACE) capsule 100 mg  100 mg Oral Daily Nelda Marseille, Amy E, MD   100 mg at 03/02/21 0930   ferrous sulfate tablet 325 mg  325 mg Oral Q breakfast Nelda Marseille, Amy E, MD   325 mg at 03/02/21 0930   magnesium hydroxide (MILK OF MAGNESIA) suspension 30 mL  30 mL Oral  Daily PRN Sharma Covert, MD       OLANZapine zydis (ZYPREXA) disintegrating tablet 20 mg  20 mg Oral QHS Sharma Covert, MD   20 mg at 03/01/21 2033   traZODone (DESYREL) tablet 100 mg  100 mg Oral QHS Sharma Covert, MD   100 mg at 03/01/21 2034   zolpidem (AMBIEN) tablet 10 mg  10 mg Oral QHS PRN Ethelene Hal, NP   10 mg at 02/27/21 2318    Lab Results: No results found for this or any previous visit (from the past 2 hour(s)).  Blood Alcohol level:  Lab Results  Component Value Date   ETH <10 02/20/2021   ETH <10 30/16/0109    Metabolic Disorder Labs: Lab Results  Component Value Date   HGBA1C 6.0 (H) 02/23/2021   MPG 125.5 02/23/2021   No results found for: PROLACTIN Lab Results  Component Value Date   CHOL 151 02/21/2021   TRIG 55 02/21/2021   HDL 35 (L) 02/21/2021   CHOLHDL 4.3 02/21/2021   VLDL 11 02/21/2021   LDLCALC 105 (H) 02/21/2021    Physical Findings: AIMS: Facial and Oral Movements Muscles of Facial Expression: None, normal Lips and Perioral Area: None, normal Jaw: None, normal Tongue: None, normal,Extremity Movements Upper (arms, wrists, hands, fingers): None, normal Lower (legs, knees, ankles, toes): None, normal, Trunk Movements Neck, shoulders, hips: None, normal, Overall Severity Severity of abnormal movements (highest score from questions above): None, normal Incapacitation due to abnormal movements: None, normal Patient's awareness of abnormal movements (rate only patient's report): No Awareness, Dental Status Current problems with teeth and/or dentures?: No Does patient usually wear dentures?: No  CIWA:    COWS:     Musculoskeletal: Strength & Muscle Tone: within normal limits Gait & Station: normal Patient leans: N/A  Psychiatric Specialty Exam:  Presentation  General Appearance: Fairly Groomed  Eye Contact:Fair  Speech:Normal Rate  Speech Volume:Normal  Handedness:Right   Mood and Affect   Mood:Dysphoric  Affect:Flat   Thought Process  Thought Processes:Goal Directed  Descriptions of Associations:Intact  Orientation:Full (Time, Place and Person)  Thought Content:Delusions; Paranoid Ideation  History of Schizophrenia/Schizoaffective disorder:Yes  Duration of Psychotic Symptoms:Greater than six months  Hallucinations:Hallucinations: Auditory Ideas of Reference:Delusions; Paranoia  Suicidal Thoughts:Suicidal Thoughts: No Homicidal Thoughts:Homicidal Thoughts: No  Sensorium  Memory:Immediate Fair; Recent Fair; Remote Fair  Judgment:Fair  Insight:Fair   Executive Functions  Concentration:Fair  Attention Span:Fair  [place]   Psychomotor Activity  Psychomotor Activity: Psychomotor Activity: Normal  Assets  Assets:Desire for  Improvement; Resilience; Social Support; Housing   Sleep  Sleep: Sleep: Good Number of Hours of Sleep: 7.5   Physical Exam: Physical Exam Vitals and nursing note reviewed.  Constitutional:      Appearance: Normal appearance.  HENT:     Head: Normocephalic and atraumatic.  Pulmonary:     Effort: Pulmonary effort is normal.  Neurological:     General: No focal deficit present.     Mental Status: She is alert and oriented to person, place, and time.   Review of Systems  All other systems reviewed and are negative. Blood pressure (!) 164/111, pulse (!) 115, temperature 98.7 F (37.1 C), temperature source Oral, resp. rate 18, height 5\' 6"  (1.676 m), weight 99.8 kg, last menstrual period 02/18/2021, SpO2 100 %. Body mass index is 35.51 kg/m.   Treatment Plan Summary: Daily contact with patient to assess and evaluate symptoms and progress in treatment, Medication management, and Plan patient is seen and examined.  Patient is a 30 year old female with the above-stated past psychiatric history who is seen in follow-up.  Diagnosis: 1.  Paranoid schizophrenia. 2.   Anemia. 3.  Insomnia. 4.  Hypertension.  Pertinent findings on examination today: 1.  Auditory and visual hallucinations have continued to be gone. 2.  She continues to have some paranoia and delusional thinking, but much less than on admission. 3.  Sleep is improved. 4.  Blood pressure remains elevated.  Plan: 1.  Change Klonopin to 0.5 mg p.o. twice daily as needed anxiety. 2.  Continue ferrous sulfate 325 mg p.o. daily with breakfast for anemia. 3.  Continue Zyprexa 20 mg p.o. nightly for psychosis, mood stability and sleep 4.  Continue trazodone 100 mg p.o. nightly for insomnia. 5.  Add Norvasc 5 mg p.o. daily for hypertension and titrate. 6.  Disposition planning-if all goes well hopefully discharge in 1 to 2 days.  Sharma Covert, MD 03/02/2021, 3:01 PM

## 2021-03-03 MED ORDER — AMLODIPINE BESYLATE 10 MG PO TABS
10.0000 mg | ORAL_TABLET | Freq: Every day | ORAL | Status: DC
  Filled 2021-03-03: qty 1

## 2021-03-03 MED ORDER — FERROUS SULFATE 325 (65 FE) MG PO TABS: 325.0000 mg | ORAL_TABLET | Freq: Every day | ORAL | 0 refills | Status: DC

## 2021-03-03 MED ORDER — AMLODIPINE BESYLATE 5 MG PO TABS
5.0000 mg | ORAL_TABLET | Freq: Once | ORAL | Status: AC
  Administered 2021-03-03: 5 mg via ORAL
  Filled 2021-03-03: qty 1

## 2021-03-03 MED ORDER — OLANZAPINE 10 MG PO TABS
20.0000 mg | ORAL_TABLET | Freq: Every day | ORAL | Status: DC
  Filled 2021-03-03: qty 2

## 2021-03-03 MED ORDER — OLANZAPINE 20 MG PO TABS: 20.0000 mg | ORAL_TABLET | Freq: Every day | ORAL | 0 refills | Status: DC

## 2021-03-03 MED ORDER — AMLODIPINE BESYLATE 10 MG PO TABS: 10.0000 mg | ORAL_TABLET | Freq: Every day | ORAL | 0 refills | Status: DC

## 2021-03-03 MED ORDER — ZOLPIDEM TARTRATE 10 MG PO TABS: 10.0000 mg | ORAL_TABLET | Freq: Every evening | ORAL | 0 refills | Status: DC | PRN

## 2021-03-03 MED ORDER — DOCUSATE SODIUM 100 MG PO CAPS: 100.0000 mg | ORAL_CAPSULE | Freq: Every day | ORAL | 0 refills | Status: DC

## 2021-03-03 MED ORDER — TRAZODONE HCL 100 MG PO TABS: 100.0000 mg | ORAL_TABLET | Freq: Every day | ORAL | 0 refills | Status: DC

## 2021-03-03 MED ORDER — CLONAZEPAM 0.5 MG PO TABS: 0.5000 mg | ORAL_TABLET | Freq: Two times a day (BID) | ORAL | 0 refills | Status: DC | PRN

## 2021-03-03 NOTE — BHH Group Notes (Signed)
Patient did not attend goals group, although he was made aware of it.  

## 2021-03-03 NOTE — Progress Notes (Signed)
  [facility] Adult Case Management Discharge Plan :  Will you be returning to the same living situation after discharge:  Yes,  father's home At discharge, do you have transportation home?: No. Safe Transport will be Arranged Do you have the ability to pay for your medications: Yes,  family support  Release of information consent forms completed and in the chart;  Patient's signature needed at discharge.  Patient to Follow up at:  Follow-up Information     [facility] Follow up on 04/03/2021.   Specialty: Professional Counselor Why: You have an appointment for therapy services on 04/03/21 at 6:00 pm.   Please call to schedule an appointment for medication management services as well. Contact information: [place] [id number] [phone]         [place]. Schedule an appointment as soon as possible for a visit.   Why: Please call and schedule an appointment to establish care for primary care services. Contact information: [place] [phone] [place]. Go to.   Specialty: Behavioral Health Why: Please go to this provider for interim therapy and medication management services.  Walk in hours are Monday through Wednesday from 8:00 am to 11:00 am. Contact information: [place] [phone]                Next level of care provider has access to [place] and Suicide Prevention discussed: Yes,  father  Have you used any form of tobacco in the last 30 days? (Cigarettes, Smokeless Tobacco, Cigars, and/or Pipes): No  Has patient been referred to the Quitline?: Patient refused referral  Patient has been referred for addiction treatment: [place], [place] 03/03/2021, 10:13 AM

## 2021-03-03 NOTE — Progress Notes (Signed)
Recreation Therapy Notes  Date: 7.1.22 Time: 1000 Location: 500 Hall Dayroom  Group Topic: Communication, Team Building, Problem Solving  Goal Area(s) Addresses:  Patient will effectively work with peer towards shared goal.  Patient will identify skills used to make activity successful.  Patient will identify how skills used during activity can be applied to reach post d/c goals.   Intervention: STEM Activity- Metallurgist  Activity: Tallest [employer]. In teams of 5-6, patients were given 12 craft pipe cleaners. Using the materials provided, patients were instructed to compete against the opposing team(s) to build the tallest free-standing structure from floor level. The activity was timed; difficulty increased by Probation officer as Pharmacist, hospital continued.  Systematically resources were removed with additional directions for example, placing one arm behind their back, working in silence, and shape stipulations. LRT facilitated post-activity discussion reviewing team processes and necessary communication skills involved in completion. Patients were encouraged to reflect how the skills utilized, or not utilized, in this activity can be incorporated to positively impact support systems post discharge.  Education: Education officer, community, Environmental health practitioner, Discharge Planning   Education Outcome: Acknowledges education/In group clarification offered/Needs additional education.   Clinical Observations/Feedback: Pt did not attend group session.    Victorino Sparrow, LRT/CTRS         Victorino Sparrow A 03/03/2021 11:47 AM

## 2021-03-03 NOTE — [facility] Suicide Risk Assessment (Signed)
[facility] Discharge Suicide Risk Assessment   Principal Problem: Schizophrenia [facility]) Discharge Diagnoses: Principal Problem:   Schizophrenia ([place])   Total Time spent with patient: 20 minutes  Musculoskeletal: Strength & Muscle Tone: within normal limits Gait & Station: normal Patient leans: N/A  Psychiatric Specialty Exam  Presentation  General Appearance: Fairly Groomed  Eye Contact:Fair  Speech:Normal Rate  Speech Volume:Normal  Handedness:Right   Mood and Affect  Mood:Euthymic  Duration of Depression Symptoms: No data recorded Affect:Congruent   Thought Process  Thought Processes:Goal Directed  Descriptions of Associations:Intact  Orientation:Full (Time, Place and Person)  Thought Content:Delusions; Paranoid Ideation  History of Schizophrenia/Schizoaffective disorder:Yes  Duration of Psychotic Symptoms:Greater than six months  Hallucinations:Hallucinations: None  Ideas of Reference:Delusions; Paranoia  Suicidal Thoughts:Suicidal Thoughts: No  Homicidal Thoughts:Homicidal Thoughts: No   Sensorium  Memory:Immediate Fair; Recent Fair; Remote Fair  Judgment:Fair  Insight:Fair   Executive Functions  Concentration:Fair  Attention Span:Good  [place]  Language:Good   Psychomotor Activity  Psychomotor Activity:Psychomotor Activity: Normal   Assets  Assets:Desire for Improvement; Resilience; Social Support; Housing   Sleep  Sleep:Sleep: Fair Number of Hours of Sleep: 5.5   Physical Exam: Physical Exam Vitals and nursing note reviewed.  Constitutional:      Appearance: Normal appearance. She is obese.  HENT:     Head: Normocephalic and atraumatic.  Pulmonary:     Effort: Pulmonary effort is normal.  Neurological:     General: No focal deficit present.     Mental Status: She is alert and oriented to person, place, and time.   Review of Systems  All other systems reviewed and are negative. Blood  pressure (!) 153/89, pulse (!) 101, temperature 98.5 F (36.9 C), temperature source Oral, resp. rate 18, height 5\' 6"  (1.676 m), weight 99.8 kg, last menstrual period 02/18/2021, SpO2 100 %. Body mass index is 35.51 kg/m.  Mental Status Per Nursing Assessment::   On Admission:  NA  Demographic Factors:  Low socioeconomic status, Living alone, and Unemployed  Loss Factors: Financial problems/change in socioeconomic status  Historical Factors: Impulsivity  Risk Reduction Factors:   Positive social support and Positive therapeutic relationship  Continued Clinical Symptoms:  Schizophrenia:   Less than 46 years old Paranoid or undifferentiated type  Cognitive Features That Contribute To Risk:  Thought constriction (tunnel vision)    Suicide Risk:  Minimal: No identifiable suicidal ideation.  Patients presenting with no risk factors but with morbid ruminations; may be classified as minimal risk based on the severity of the depressive symptoms   Follow-up [place] Follow up on 04/03/2021.   Specialty: Professional Counselor Why: You have an appointment for therapy services on 04/03/21 at 6:00 pm.   Please call to schedule an appointment for medication management services as well. Contact information: [place] [id number] [phone]         [place]. Schedule an appointment as soon as possible for a visit.   Why: Please call and schedule an appointment to establish care for primary care services. Contact information: [place] [address] [place]. Go to.   Specialty: Behavioral Health Why: Please go to this provider for interim therapy and medication management services.  Walk in hours are Monday through Wednesday from 8:00 am to 11:00 am. Contact information: [place] [phone]                Plan Of Care/Follow-up recommendations:  Activity:  ad lib  Sharma Covert, MD 03/03/2021, 9:30 AM

## 2021-03-03 NOTE — Plan of Care (Signed)
Patient was able to engage in groups in a calm and appropriate mood at least 2 times within 5 recreation therapy group sessions.   Victorino Sparrow, LRT/CTRS

## 2021-03-03 NOTE — Progress Notes (Signed)
Pt discharged to lobby. Pt was stable and appreciative at that time. All papers, samples and prescriptions were given and valuables returned. Verbal understanding expressed. Denies SI/HI and A/VH. Pt given opportunity to express concerns and ask questions.  

## 2021-03-03 NOTE — Tx Team (Signed)
Interdisciplinary Treatment and Diagnostic Plan Update  03/03/2021 Time of Session: 10:00am  Ashley Vega MRN: [id number]  Principal Diagnosis: Schizophrenia [facility])  Secondary Diagnoses: Principal Problem:   Schizophrenia ([place])   Current Medications:  Current Facility-Administered Medications  Medication Dose Route Frequency Provider Last Rate Last Admin   acetaminophen (TYLENOL) tablet 650 mg  650 mg Oral Q6H PRN Sharma Covert, MD       alum & mag hydroxide-simeth (MAALOX/MYLANTA) 200-200-20 MG/5ML suspension 30 mL  30 mL Oral Q4H PRN Sharma Covert, MD       [START ON 03/04/2021] amLODipine (NORVASC) tablet 10 mg  10 mg Oral Daily Sharma Covert, MD       clonazePAM Bobbye [place]) tablet 0.5 mg  0.5 mg Oral BID PRN Sharma Covert, MD       docusate sodium (COLACE) capsule 100 mg  100 mg Oral Daily Nelda Marseille, Amy E, MD   100 mg at 03/03/21 0841   ferrous sulfate tablet 325 mg  325 mg Oral Q breakfast Viann Fish E, MD   325 mg at 03/03/21 0841   magnesium hydroxide (MILK OF MAGNESIA) suspension 30 mL  30 mL Oral Daily PRN Sharma Covert, MD       OLANZapine (ZYPREXA) tablet 20 mg  20 mg Oral QHS Sharma Covert, MD       traZODone (DESYREL) tablet 100 mg  100 mg Oral QHS Sharma Covert, MD   100 mg at 03/02/21 2110   zolpidem (AMBIEN) tablet 10 mg  10 mg Oral QHS PRN Ethelene Hal, NP   10 mg at 02/27/21 2318   PTA Medications: Medications Prior to Admission  Medication Sig Dispense Refill Last Dose   acetaminophen (TYLENOL) 325 MG tablet Take 650 mg by mouth every 6 (six) hours as needed for mild pain or headache.      ibuprofen (ADVIL) 400 MG tablet Take 400 mg by mouth every 6 (six) hours as needed for mild pain or headache.      ABILIFY 10 MG tablet Take 10 mg by mouth daily.      RISPERDAL 1 MG tablet Take 1 mg by mouth at bedtime.       Patient Stressors: Health problems Occupational concerns  Patient Strengths: Ability for  insight Communication skills Physical Health Supportive family/friends  Treatment Modalities: Medication Management, Group therapy, Case management,  1 to 1 session with clinician, Psychoeducation, Recreational therapy.   Physician Treatment Plan for Primary Diagnosis: Schizophrenia ([place]) Long Term Goal(s): Improvement in symptoms so as ready for discharge   Short Term Goals: Ability to demonstrate self-control will improve Ability to identify and develop effective coping behaviors will improve  Medication Management: Evaluate patient's response, side effects, and tolerance of medication regimen.  Therapeutic Interventions: 1 to 1 sessions, Unit Group sessions and Medication administration.  Evaluation of Outcomes: Adequate for Discharge  Physician Treatment Plan for Secondary Diagnosis: Principal Problem:   Schizophrenia ([place])  Long Term Goal(s): Improvement in symptoms so as ready for discharge   Short Term Goals: Ability to demonstrate self-control will improve Ability to identify and develop effective coping behaviors will improve     Medication Management: Evaluate patient's response, side effects, and tolerance of medication regimen.  Therapeutic Interventions: 1 to 1 sessions, Unit Group sessions and Medication administration.  Evaluation of Outcomes: Adequate for Discharge   RN Treatment Plan for Primary Diagnosis: Schizophrenia [facility]) Long Term Goal(s): Knowledge of disease and therapeutic regimen to maintain health will improve  Short Term Goals: Ability to remain free from injury will improve, Ability to participate in decision making will improve, Ability to verbalize feelings will improve, Ability to disclose and discuss suicidal ideas, and Ability to identify and develop effective coping behaviors will improve  Medication Management: RN will administer medications as ordered by provider, will assess and evaluate patient's response and provide education to patient  for prescribed medication. RN will report any adverse and/or side effects to prescribing provider.  Therapeutic Interventions: 1 on 1 counseling sessions, Psychoeducation, Medication administration, Evaluate responses to treatment, Monitor vital signs and CBGs as ordered, Perform/monitor CIWA, COWS, AIMS and Fall Risk screenings as ordered, Perform wound care treatments as ordered.  Evaluation of Outcomes: Adequate for Discharge   LCSW Treatment Plan for Primary Diagnosis: Schizophrenia [facility]) Long Term Goal(s): Safe transition to appropriate next level of care at discharge, Engage patient in therapeutic group addressing interpersonal concerns.  Short Term Goals: Engage patient in aftercare planning with referrals and resources, Increase social support, Increase emotional regulation, Facilitate acceptance of mental health diagnosis and concerns, Identify triggers associated with mental health/substance abuse issues, and Increase skills for wellness and recovery  Therapeutic Interventions: Assess for all discharge needs, 1 to 1 time with Social worker, Explore available resources and support systems, Assess for adequacy in community support network, Educate family and significant other(s) on suicide prevention, Complete Psychosocial Assessment, Interpersonal group therapy.  Evaluation of Outcomes: Adequate for Discharge   Progress in Treatment: Attending groups: Yes. Participating in groups: Yes. Taking medication as prescribed: Yes. Toleration medication: Yes. Family/Significant other contact made: Yes, individual(s) contacted:  father Patient understands diagnosis: No. Discussing patient identified problems/goals with staff: Yes. Medical problems stabilized or resolved: Yes. Denies suicidal/homicidal ideation: Yes. Issues/concerns per patient self-inventory: No.   New problem(s) identified: No, Describe:  None   New Short Term/Long Term Goal(s): medication stabilization, elimination  of SI thoughts, development of comprehensive mental wellness plan.   Patient Goals:  Did not attend  Discharge Plan or Barriers: Patient is to return home to live with family at discharge. Pt is to follow up with established provider at the [facility].  Reason for Continuation of Hospitalization: Hallucinations Medication stabilization Suicidal ideation  Estimated Length of Stay: 3 to 5 days   Attendees: Patient: Did not attend 03/03/2021   Physician:  03/03/2021  Nursing:  03/03/2021   RN Care Manager: 03/03/2021   Social Worker: Darletta Moll, LCSW 03/03/2021   Recreational Therapist:  03/03/2021   Other:  03/03/2021   Other:  03/03/2021   Other: 03/03/2021     Scribe for Treatment Team: Mliss Fritz, [place] 03/03/2021 10:11 AM

## 2021-03-03 NOTE — Discharge Summary (Signed)
Physician Discharge Summary Note  Patient:  Ashley Vega is an 30 y.o., female MRN:  [id number] DOB:  08-26-1991 Patient phone:  [phone] (home)  Patient address:   [place] [address],  Total Time spent with patient:  Greater than 30 minutes  Date of Admission:  02/20/2021 Date of Discharge: 03-03-21  Reason for Admission: Worsening auditory hallucinations.  Principal Problem: Schizophrenia [facility]) Discharge Diagnoses: Principal Problem:   Schizophrenia [facility])  Past Psychiatric History: Schizophrenia.  Past Medical History:  Past Medical History:  Diagnosis Date   Fibroids    No pertinent past medical history     Past Surgical History:  Procedure Laterality Date   NO PAST SURGERIES     Family History:  Family History  Problem Relation Age of Onset   Cancer Maternal Grandmother    Hypertension Maternal Grandfather    Kidney disease Maternal Grandfather    Other Neg Hx    Family Psychiatric  History: See H&P  Social History:  Social History   Substance and Sexual Activity  Alcohol Use No     Social History   Substance and Sexual Activity  Drug Use No    Social History   Socioeconomic History   Marital status: Single    Spouse name: Not on file   Number of children: Not on file   Years of education: Not on file   Highest education level: Not on file  Occupational History   Not on file  Tobacco Use   Smoking status: Former    Pack years: 0.00    Types: Cigars   Smokeless tobacco: Never  Vaping Use   Vaping Use: Never used  Substance and Sexual Activity   Alcohol use: No   Drug use: No   Sexual activity: Yes    Birth control/protection: None  Other Topics Concern   Not on file  Social History Narrative   Not on file   Social Determinants of Health   Financial Resource Strain: Not on file  Food Insecurity: Not on file  Transportation Needs: Not on file  Physical Activity: Not on file  Stress: Not on file  Social  Connections: Not on file   Hospital Course: (Per Md's admission evaluation notes): Patient is seen and examined.  Patient is a 30 year old female with reported past psychiatric history significant for schizophrenia who originally presented to the [facility] emergency department on 02/19/2021 with auditory hallucinations.  The patient stated at that time the voices have been telling her bad things about her self.  She denied any suicidal or homicidal ideation.  She was seen by the comprehensive clinical assessment team and complained of auditory hallucinations there.  She stated that she had first heard the voices approximately a year ago, and then "they went away".  She stated that they returned approximately a week ago.  She stated that they told her that she should "give up".  She denied any suicidal or homicidal ideation.  Apparently her father is her primary support, and she lives with him.  Her mother died apparently in 10/11/20.  The patient is a terrible historian, and the history she gives is quite vague, and the majority of it came from the electronic medical record.  She apparently [facility] [place].  She stated on more than one occasion today that she was looking for "a new primary".  She had been hospitalized at [facility] in [place] in 10-11-17.  Her  diagnosis at that time was paranoid schizophrenia.  She was discharged on Risperdal 1 mg p.o. twice daily.  Although the patient stated that she had not been referred to psychiatry after she left [facility] she mentioned that she had been to [facility], and that they had given her an unspecified long-acting injectable medication.  She stated today that she went to "[facility] to get the Abilify injection".  She admitted to auditory hallucinations, she appeared to be quite guarded and paranoid.  She was gazing around the room.  Review of the notes from [facility] she was vague there as well.  She mentioned  something about the Abilify at that point and mentioned having "adverse effects".  She stated that at that time "it messed up my health and increase my anxiety".  She reported that she had been at [facility] for 7 days in 2017.  At the end of that hospitalization she was discharged on Risperdal and trazodone.  PMP database is essentially negative.  She was admitted to the hospital for evaluation and stabilization.  No collateral information was obtained by the comprehensive clinical service.  Prior to this discharge, Ashley Vega was seen & evaluated for mental health stability. The current laboratory findings were reviewed (stable), nurses notes & vital signs were reviewed as well. There are no current mental health or medical issues that should prevent this discharge at this time. Patient is being discharged to continue mental health care as noted below.   As noted above, Ashley Vega was admitted to the [facility] adult unit for worsening auditory hallucinations due to hx of Schizophrenia. She presented to the hospital psychotic, saying the voices were telling her bad things about herself & that she will give up. Apparently, she has been experiencing auditory hallucinations since the last year. She was brought to the hospital for evaluation/treatments. After evaluation of her presenting symptoms, Ashley Vega was recommended for mood stabilization treatment. The medication regimen targeting her presenting symptoms were discussed & initiated. Patient was involved in making decisions pertaining to her care. She was treated, stabilized & discharged on the medications as listed below on her discharge medication lists.  She presented other significant pre-existing medical problems that required treatment. She was treated & discharged on all the medications used to treat to those conditions. She tolerated her treatment regimen without any adverse effects or reactions reported. Ashley Vega's symptoms responded well to her treatment regimen warranting this  discharge. This is evidenced by her reports of improved mood, absence of hallucinations, delusions or paranoia & readiness for discharge.  During there course of her hospital stay, Inna was evaluated daily by a clinical provider to ascertain her response to her treatment regimen. As the days go by, improvement was noted as evidenced by her reports of decreasing symptoms, improved sleep, mood, affect & participation in the unit group programming. She was required on daily basis to complete a self-inventory asssessment noting mood, mental status, new symptoms, anxiety or concerns. Her symptoms did gradually improve & being in a therapeutic and supportive environment also assisted in his mood stability.   On this day of her hospital discharge, Rozalynn was in much improved condition than upon admission. Her symptoms were reported as significantly decreased or resolved completely. Upon discharge, she denies SI/HI and voiced no AVH. She was motivated to continue taking medication with a goal of continued improvement in mental health. She is discharged to follow-up care for medication management & routine psychiatric as noted below. She left [facility] with all personal belongings in no  apparent distress.   Physical Findings: AIMS: Facial and Oral Movements Muscles of Facial Expression: None, normal Lips and Perioral Area: None, normal Jaw: None, normal Tongue: None, normal,Extremity Movements Upper (arms, wrists, hands, fingers): None, normal Lower (legs, knees, ankles, toes): None, normal, Trunk Movements Neck, shoulders, hips: None, normal, Overall Severity Severity of abnormal movements (highest score from questions above): None, normal Incapacitation due to abnormal movements: None, normal Patient's awareness of abnormal movements (rate only patient's report): No Awareness, Dental Status Current problems with teeth and/or dentures?: No Does patient usually wear dentures?: No  CIWA:    COWS:      Musculoskeletal: Strength & Muscle Tone: within normal limits Gait & Station: normal Patient leans: N/A   Psychiatric Specialty Exam:  Presentation  General Appearance: Fairly Groomed  Eye Contact:Fair  Speech:Normal Rate  Speech Volume:Normal  Handedness:Right   Mood and Affect  Mood:Euthymic  Affect:Congruent   Thought Process  Thought Processes:Goal Directed  Descriptions of Associations:Intact  Orientation:Full (Time, Place and Person)  Thought Content:Delusions; Paranoid Ideation  History of Schizophrenia/Schizoaffective disorder:Yes  Duration of Psychotic Symptoms:Greater than six months  Hallucinations:Hallucinations: None  Ideas of Reference:Delusions; Paranoia  Suicidal Thoughts:Suicidal Thoughts: No  Homicidal Thoughts:Homicidal Thoughts: No   Sensorium  Memory:Immediate Fair; Recent Fair; Remote Fair  Judgment:Fair  Insight:Fair   Executive Functions  Concentration:Fair  Attention Span:Good  [place]  Language:Good   Psychomotor Activity  Psychomotor Activity:Psychomotor Activity: Normal   Assets  Assets:Desire for Improvement; Resilience; Social Support; Housing   Sleep  Sleep:Sleep: Fair Number of Hours of Sleep: 5.5    Physical Exam: Physical Exam Vitals and nursing note reviewed.  HENT:     Head: Normocephalic.     Nose: Nose normal.     Mouth/Throat:     Pharynx: Oropharynx is clear.  Eyes:     Pupils: Pupils are equal, round, and reactive to light.  Cardiovascular:     Rate and Rhythm: Normal rate.  Pulmonary:     Effort: Pulmonary effort is normal.  Genitourinary:    Comments: Deferred Musculoskeletal:        General: Normal range of motion.     Cervical back: Normal range of motion.  Skin:    General: Skin is warm.  Neurological:     General: No focal deficit present.     Mental Status: She is alert and oriented to person, place, and time. Mental status is at  baseline.   Review of Systems  Constitutional: Negative.   HENT: Negative.    Eyes: Negative.   Respiratory: Negative.    Cardiovascular: Negative.   Gastrointestinal: Negative.   Genitourinary: Negative.   Musculoskeletal: Negative.   Skin: Negative.   Neurological: Negative.   Endo/Heme/Allergies:        See allergy lists.  Psychiatric/Behavioral:  Positive for hallucinations (Hx psychosis (stable on medication)). Negative for depression, memory loss, substance abuse and suicidal ideas. The patient has insomnia (Hx. of (stable on medication)). The patient is not nervous/anxious (Stable upon discharge).   Blood pressure (!) 153/89, pulse (!) 101, temperature 98.5 F (36.9 C), temperature source Oral, resp. rate 18, height 5\' 6"  (1.676 m), weight 99.8 kg, last menstrual period 02/18/2021, SpO2 100 %. Body mass index is 35.51 kg/m.   Social History   Tobacco Use  Smoking Status Former   Pack years: 0.00   Types: Cigars  Smokeless Tobacco Never   Tobacco Cessation:  N/A, patient does not currently use tobacco products  Blood Alcohol  level:  Lab Results  Component Value Date   ETH <10 02/20/2021   ETH <10 36/62/9476   Metabolic Disorder Labs:  Lab Results  Component Value Date   HGBA1C 6.0 (H) 02/23/2021   MPG 125.5 02/23/2021   No results found for: PROLACTIN Lab Results  Component Value Date   CHOL 151 02/21/2021   TRIG 55 02/21/2021   HDL 35 (L) 02/21/2021   CHOLHDL 4.3 02/21/2021   VLDL 11 02/21/2021   LDLCALC 105 (H) 02/21/2021   See Psychiatric Specialty Exam and Suicide Risk Assessment completed by Attending Physician prior to discharge.  Discharge destination:  Home  Is patient on multiple antipsychotic therapies at discharge:  No   Has Patient had three or more failed trials of antipsychotic monotherapy by history:  No  Recommended Plan for Multiple Antipsychotic Therapies: NA  Allergies as of 03/03/2021       Reactions   Peanut-containing Drug  Products Anaphylaxis        Medication List     STOP taking these medications    Abilify 10 MG tablet Generic drug: ARIPiprazole   acetaminophen 325 MG tablet Commonly known as: TYLENOL   ibuprofen 400 MG tablet Commonly known as: ADVIL   RisperDAL 1 MG tablet Generic drug: risperiDONE       TAKE these medications      Indication  amLODipine 10 MG tablet Commonly known as: NORVASC Take 1 tablet (10 mg total) by mouth daily. For high blood pressure Start taking on: March 04, 2021  Indication: High Blood Pressure Disorder   clonazePAM 0.5 MG tablet Commonly known as: KLONOPIN Take 1 tablet (0.5 mg total) by mouth 2 (two) times daily as needed (anxiety).  Indication: Feeling Anxious   docusate sodium 100 MG capsule Commonly known as: COLACE Take 1 capsule (100 mg total) by mouth daily. (May buy from over the counter): For constipation Start taking on: March 04, 2021  Indication: Constipation   ferrous sulfate 325 (65 FE) MG tablet Take 1 tablet (325 mg total) by mouth daily with breakfast. (May buy from over the counter): For anemia Start taking on: March 04, 2021  Indication: Anemia From Inadequate Iron in the Body   OLANZapine 20 MG tablet Commonly known as: ZYPREXA Take 1 tablet (20 mg total) by mouth at bedtime. For mood control  Indication: Mood control   traZODone 100 MG tablet Commonly known as: DESYREL Take 1 tablet (100 mg total) by mouth at bedtime. For sleep  Indication: Trouble Sleeping   zolpidem 10 MG tablet Commonly known as: AMBIEN Take 1 tablet (10 mg total) by mouth at bedtime as needed for sleep.  Indication: [place], Insomnia        Follow-up [place] up on 04/03/2021.   Specialty: Professional Counselor Why: You have an appointment for therapy services on 04/03/21 at 6:00 pm.   Please call to schedule an appointment for medication management services as well. Contact  information: [place] [id number] [phone]         [place]. Schedule an appointment as soon as possible for a visit.   Why: Please call and schedule an appointment to establish care for primary care services. Contact information: [place] [phone] [place]. Go to.   Specialty: Behavioral Health Why: Please go to this provider  for interim therapy and medication management services.  Walk in hours are Monday through Wednesday from 8:00 am to 11:00 am. Contact information: [place]               Follow-up recommendations: Activity:  As tolerated Diet: As recommended by your primary care doctor. Keep all scheduled follow-up appointments as recommended.   Comments: Prescriptions given at discharge.  Patient agreeable to plan.  Given opportunity to ask questions.  Appears to feel comfortable with discharge denies any current suicidal or homicidal thought. Patient is also instructed prior to discharge to: Take all medications as prescribed by his/her mental healthcare provider. Report any adverse effects and or reactions from the medicines to his/her outpatient provider promptly. Patient has been instructed & cautioned: To not engage in alcohol and or illegal drug use while on prescription medicines. In the event of worsening symptoms, patient is instructed to call the crisis hotline, 911 and or go to the nearest ED for appropriate evaluation and treatment of symptoms. To follow-up with his/her primary care provider for your other medical issues, concerns and or health care needs.   Signed: Lindell Spar, NP, pmhnp, fnp-bc 03/03/2021, 11:23 AM

## 2021-03-03 NOTE — Progress Notes (Signed)
Recreation Therapy Notes  INPATIENT RECREATION TR PLAN  Patient Details Name: Ashley Vega MRN: [id number] DOB: 1991/04/02 Today's Date: 03/03/2021  Rec Therapy Plan Is patient appropriate for Therapeutic Recreation?: Yes Treatment times per week: about 3 days Estimated Length of Stay: 5-7 days TR Treatment/Interventions: Group participation (Comment)  Discharge Criteria Pt will be discharged from therapy if:: Discharged Treatment plan/goals/alternatives discussed and agreed upon by:: Patient/family  Discharge Summary Short term goals set: See patient care plan Short term goals met: Complete Progress toward goals comments: Groups attended Which groups?: Self-esteem, Coping skills, Communication, Leisure education Reason goals not met: None Therapeutic equipment acquired: N/A Reason patient discharged from therapy: Discharge from hospital Pt/family agrees with progress & goals achieved: Yes Date patient discharged from therapy: 03/03/21   Victorino Sparrow, LRT/CTRS   Ria Comment, [place] 03/03/2021, 11:56 AM

## 2021-03-07 ENCOUNTER — Emergency Department (HOSPITAL_COMMUNITY)
Admission: EM | Admit: 2021-03-07 | Discharge: 2021-03-07 | Disposition: A | Discharge status: Home / Self Care | Payer: BLUE CROSS/BLUE SHIELD | Attending: Emergency Medicine | Admitting: Emergency Medicine

## 2021-03-07 ENCOUNTER — Other Ambulatory Visit: Payer: Self-pay

## 2021-03-07 DIAGNOSIS — Z5321 Procedure and treatment not carried out due to patient leaving prior to being seen by health care provider: Secondary | ICD-10-CM | POA: Diagnosis not present

## 2021-03-07 DIAGNOSIS — R0602 Shortness of breath: Secondary | ICD-10-CM | POA: Diagnosis present

## 2021-03-07 DIAGNOSIS — Z20822 Contact with and (suspected) exposure to covid-19: Secondary | ICD-10-CM | POA: Diagnosis not present

## 2021-03-07 NOTE — ED Triage Notes (Signed)
Pt reports shob x 2 days. Denies sick contacts.

## 2021-03-07 NOTE — ED Notes (Signed)
Called pts name inside and outside, no response.

## 2021-03-08 LAB — SARS CORONAVIRUS 2 (TAT 6-24 HRS): SARS Coronavirus 2: NEGATIVE

## 2021-03-22 ENCOUNTER — Encounter (HOSPITAL_COMMUNITY): Payer: Self-pay | Admitting: Emergency Medicine

## 2021-03-22 ENCOUNTER — Emergency Department (HOSPITAL_COMMUNITY)
Admission: EM | Admit: 2021-03-22 | Discharge: 2021-03-23 | Disposition: A | Discharge status: Home / Self Care | Payer: BLUE CROSS/BLUE SHIELD | Attending: Emergency Medicine | Admitting: Emergency Medicine

## 2021-03-22 DIAGNOSIS — F29 Unspecified psychosis not due to a substance or known physiological condition: Secondary | ICD-10-CM | POA: Insufficient documentation

## 2021-03-22 DIAGNOSIS — Z79899 Other long term (current) drug therapy: Secondary | ICD-10-CM | POA: Diagnosis not present

## 2021-03-22 DIAGNOSIS — Z91010 Allergy to peanuts: Secondary | ICD-10-CM | POA: Insufficient documentation

## 2021-03-22 DIAGNOSIS — F209 Schizophrenia, unspecified: Secondary | ICD-10-CM | POA: Insufficient documentation

## 2021-03-22 DIAGNOSIS — Z87891 Personal history of nicotine dependence: Secondary | ICD-10-CM | POA: Insufficient documentation

## 2021-03-22 DIAGNOSIS — Z20822 Contact with and (suspected) exposure to covid-19: Secondary | ICD-10-CM | POA: Diagnosis not present

## 2021-03-22 DIAGNOSIS — R443 Hallucinations, unspecified: Secondary | ICD-10-CM | POA: Diagnosis present

## 2021-03-22 LAB — CBC
HCT: 36.7 % (ref 36.0–46.0)
Hemoglobin: 10.6 g/dL — ABNORMAL LOW (ref 12.0–15.0)
MCH: 20.9 pg — ABNORMAL LOW (ref 26.0–34.0)
MCHC: 28.9 g/dL — ABNORMAL LOW (ref 30.0–36.0)
MCV: 72.2 fL — ABNORMAL LOW (ref 80.0–100.0)
Platelets: 371 10*3/uL (ref 150–400)
RBC: 5.08 MIL/uL (ref 3.87–5.11)
RDW: 18.6 % — ABNORMAL HIGH (ref 11.5–15.5)
WBC: 6.1 10*3/uL (ref 4.0–10.5)
nRBC: 0 % (ref 0.0–0.2)

## 2021-03-22 LAB — RESP PANEL BY RT-PCR (FLU A&B, COVID) ARPGX2
Influenza A by PCR: NEGATIVE
Influenza B by PCR: NEGATIVE
SARS Coronavirus 2 by RT PCR: NEGATIVE

## 2021-03-22 LAB — COMPREHENSIVE METABOLIC PANEL
ALT: 19 U/L (ref 0–44)
AST: 20 U/L (ref 15–41)
Albumin: 3.5 g/dL (ref 3.5–5.0)
Alkaline Phosphatase: 73 U/L (ref 38–126)
Anion gap: 7 (ref 5–15)
BUN: 9 mg/dL (ref 6–20)
CO2: 26 mmol/L (ref 22–32)
Calcium: 9 mg/dL (ref 8.9–10.3)
Chloride: 101 mmol/L (ref 98–111)
Creatinine, Ser: 0.87 mg/dL (ref 0.44–1.00)
GFR, Estimated: >60 mL/min (ref >60)
Glucose, Bld: 111 mg/dL — ABNORMAL HIGH (ref 70–99)
Potassium: 3.5 mmol/L (ref 3.5–5.1)
Sodium: 134 mmol/L — ABNORMAL LOW (ref 135–145)
Total Bilirubin: 0.5 mg/dL (ref 0.3–1.2)
Total Protein: 7.4 g/dL (ref 6.5–8.1)

## 2021-03-22 LAB — I-STAT BETA HCG BLOOD, ED (MC, WL, AP ONLY): I-stat hCG, quantitative: <5 m[IU]/mL (ref <5)

## 2021-03-22 LAB — RAPID URINE DRUG SCREEN, HOSP PERFORMED
Amphetamines: NOT DETECTED
Barbiturates: NOT DETECTED
Benzodiazepines: NOT DETECTED
Cocaine: NOT DETECTED
Opiates: NOT DETECTED
Tetrahydrocannabinol: NOT DETECTED

## 2021-03-22 MED ORDER — OLANZAPINE 10 MG PO TABS: 20.0000 mg | ORAL_TABLET | Freq: Every day | ORAL | Status: DC

## 2021-03-22 MED ORDER — TRAZODONE HCL 50 MG PO TABS: 100.0000 mg | ORAL_TABLET | Freq: Every day | ORAL | Status: DC

## 2021-03-22 MED ORDER — ZOLPIDEM TARTRATE 5 MG PO TABS: 5.0000 mg | ORAL_TABLET | Freq: Every evening | ORAL | Status: DC | PRN

## 2021-03-22 MED ORDER — ZIPRASIDONE MESYLATE 20 MG IM SOLR: 20.0000 mg | INTRAMUSCULAR | Status: DC | PRN

## 2021-03-22 MED ORDER — AMLODIPINE BESYLATE 5 MG PO TABS: 10.0000 mg | ORAL_TABLET | Freq: Every day | ORAL | Status: DC

## 2021-03-22 MED ORDER — ACETAMINOPHEN 325 MG PO TABS: 650.0000 mg | ORAL_TABLET | ORAL | Status: DC | PRN

## 2021-03-22 MED ORDER — ALUM & MAG HYDROXIDE-SIMETH 200-200-20 MG/5ML PO SUSP: 30.0000 mL | Freq: Four times a day (QID) | ORAL | Status: DC | PRN

## 2021-03-22 MED ORDER — RISPERIDONE 2 MG PO TBDP
2.0000 mg | ORAL_TABLET | Freq: Three times a day (TID) | ORAL | Status: DC | PRN
  Filled 2021-03-22: qty 1

## 2021-03-22 MED ORDER — NICOTINE 21 MG/24HR TD PT24
21.0000 mg | MEDICATED_PATCH | Freq: Every day | TRANSDERMAL | Status: DC
  Administered 2021-03-22 – 2021-03-23 (×2): 21 mg via TRANSDERMAL
  Filled 2021-03-22 (×2): qty 1

## 2021-03-22 MED ORDER — LORAZEPAM 1 MG PO TABS: 1.0000 mg | ORAL_TABLET | ORAL | Status: DC | PRN

## 2021-03-22 MED ORDER — ONDANSETRON HCL 4 MG PO TABS: 4.0000 mg | ORAL_TABLET | Freq: Three times a day (TID) | ORAL | Status: DC | PRN
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
## 2021-03-22 NOTE — ED Provider Notes (Signed)
[facility] EMERGENCY DEPARTMENT Provider Note   CSN: [id number] Arrival date & time: 03/22/21  1041     History Chief Complaint  Patient presents with   Hallucinations    Ashley Vega is a 30 y.o. female.  The history is provided by the patient and medical records. No language interpreter was used.   30 year old female significant history of schizophrenia who presenting with report of worsening hallucination.  Patient report for the past couple weeks she has been having recurrent auditory hallucinations without command hallucination.  She does endorse feeling a bit depressed.  She denies SI or HI.  She endorsed eating more, and sleeping less.  Stop she denies self-medicating with alcohol or drugs.  She admits she ran out of her psychiatric medication for the past week, did reach out to her doctor but was told to come to the ER to be screened thus prompting this ER visit.  She denies any recent medication changes.    Past Medical History:  Diagnosis Date   Fibroids    No pertinent past medical history     Patient Active Problem List   Diagnosis Date Noted   Schizophrenia ([place]) 02/20/2021   Fibroids 02/06/2013   Heart murmur 02/06/2013   Irregular menses 02/06/2013    Past Surgical History:  Procedure Laterality Date   NO PAST SURGERIES       OB History     Gravida  1   Para      Term      Preterm      AB  1   Living  0      SAB  1   IAB      Ectopic      Multiple      Live Births              Family History  Problem Relation Age of Onset   Cancer Maternal Grandmother    Hypertension Maternal Grandfather    Kidney disease Maternal Grandfather    Other Neg Hx     Social History   Tobacco Use   Smoking status: Former    Types: Cigars   Smokeless tobacco: Never  Vaping Use   Vaping Use: Never used  Substance Use Topics   Alcohol use: No   Drug use: No    Home Medications Prior to Admission medications   Medication  Sig Start Date End Date Taking? Authorizing Provider  amLODipine (NORVASC) 10 MG tablet Take 1 tablet (10 mg total) by mouth daily. For high blood pressure 03/04/21   Nwoko, Herbert Pun I, NP  clonazePAM (KLONOPIN) 0.5 MG tablet Take 1 tablet (0.5 mg total) by mouth 2 (two) times daily as needed (anxiety). 03/03/21   Lindell Spar I, NP  docusate sodium (COLACE) 100 MG capsule Take 1 capsule (100 mg total) by mouth daily. (May buy from over the counter): For constipation 03/04/21   Lindell Spar I, NP  ferrous sulfate 325 (65 FE) MG tablet Take 1 tablet (325 mg total) by mouth daily with breakfast. (May buy from over the counter): For anemia 03/04/21   Lindell Spar I, NP  OLANZapine (ZYPREXA) 20 MG tablet Take 1 tablet (20 mg total) by mouth at bedtime. For mood control 03/03/21   Lindell Spar I, NP  traZODone (DESYREL) 100 MG tablet Take 1 tablet (100 mg total) by mouth at bedtime. For sleep 03/03/21   Lindell Spar I, NP  zolpidem (AMBIEN) 10 MG tablet Take 1 tablet (10  mg total) by mouth at bedtime as needed for sleep. 03/03/21   Lindell Spar I, NP  budesonide (RHINOCORT AQUA) 32 MCG/ACT nasal spray Place 1 spray into both nostrils daily. Patient not taking: Reported on 10/02/2017 08/11/16 09/15/20  Dalia Heading, PA-C    Allergies    Peanut-containing drug products  Review of Systems   Review of Systems  All other systems reviewed and are negative.  Physical Exam Updated Vital Signs BP 127/82   Pulse 68   Temp 98.3 F (36.8 C) (Oral)   Resp 18   SpO2 100%   Physical Exam Vitals and nursing note reviewed.  Constitutional:      General: She is not in acute distress.    Appearance: She is well-developed. She is obese.  HENT:     Head: Atraumatic.  Eyes:     Conjunctiva/sclera: Conjunctivae normal.  Cardiovascular:     Rate and Rhythm: Normal rate and regular rhythm.     Pulses: Normal pulses.     Heart sounds: Normal heart sounds.  Pulmonary:     Effort: Pulmonary effort is normal.   Abdominal:     Palpations: Abdomen is soft.     Tenderness: There is no abdominal tenderness.  Musculoskeletal:     Cervical back: Neck supple.  Skin:    Findings: No rash.  Neurological:     Mental Status: She is alert. Mental status is at baseline.  Psychiatric:        Attention and Perception: Attention normal.        Mood and Affect: Mood normal.        Speech: Speech normal.        Behavior: Behavior is cooperative.        Thought Content: Thought content does not include homicidal or suicidal ideation.    ED Results / Procedures / Treatments   Labs (all labs ordered are listed, but only abnormal results are displayed) Labs Reviewed  COMPREHENSIVE METABOLIC PANEL - Abnormal; Notable for the following components:      Result Value   Sodium 134 (*)    Glucose, Bld 111 (*)    All other components within normal limits  CBC - Abnormal; Notable for the following components:   Hemoglobin 10.6 (*)    MCV 72.2 (*)    MCH 20.9 (*)    MCHC 28.9 (*)    RDW 18.6 (*)    All other components within normal limits  RESP PANEL BY RT-PCR (FLU A&B, COVID) ARPGX2  RAPID URINE DRUG SCREEN, HOSP PERFORMED  ETHANOL  SALICYLATE LEVEL  ACETAMINOPHEN LEVEL  I-STAT BETA HCG BLOOD, ED ([facility], [facility], [facility] ONLY)    EKG None  Radiology No results found.  Procedures Procedures   Medications Ordered in ED Medications  risperiDONE (RISPERDAL M-TABS) disintegrating tablet 2 mg (has no administration in time range)    And  LORazepam (ATIVAN) tablet 1 mg (has no administration in time range)    And  ziprasidone (GEODON) injection 20 mg (has no administration in time range)  acetaminophen (TYLENOL) tablet 650 mg (has no administration in time range)  zolpidem (AMBIEN) tablet 5 mg (has no administration in time range)  ondansetron (ZOFRAN) tablet 4 mg (has no administration in time range)  alum & mag hydroxide-simeth (MAALOX/MYLANTA) 200-200-20 MG/5ML suspension 30 mL (has no administration in  time range)  nicotine (NICODERM CQ - dosed in mg/24 hours) patch 21 mg (has no administration in time range)    ED Course  I  have reviewed the triage vital signs and the nursing notes.  Pertinent labs & imaging results that were available during my care of the patient were reviewed by me and considered in my medical decision making (see chart for details).    MDM Rules/Calculators/A&P                           BP 127/82   Pulse 68   Temp 98.3 F (36.8 C) (Oral)   Resp 18   SpO2 100%   Final Clinical Impression(s) / ED Diagnoses Final diagnoses:  Psychosis, unspecified psychosis type ([place])    Rx / DC Orders ED Discharge Orders     None      1:00 PM Patient endorsed having to do with auditory hallucination for the past week.  Also ran out of her psychiatric medication for the same duration.  No SI HI no command hallucination.  At this time she is resting comfortably.  Patient is medically clear, will consult TTS and psychiatry for further assessment of her psychosis.   Domenic Moras, PA-C 03/22/21 1509    Dorie Rank, MD 03/24/21 [phone]

## 2021-03-22 NOTE — ED Notes (Signed)
Pt wanded by security. 

## 2021-03-22 NOTE — ED Notes (Signed)
Talking to TTS

## 2021-03-22 NOTE — ED Triage Notes (Signed)
Patient complains of auditory hallucinations, states she has "slowed down on taking her antipsychotic medication". Patient alert, oriented, and in no apparent distress at this time.

## 2021-03-23 ENCOUNTER — Ambulatory Visit (HOSPITAL_COMMUNITY)
Admission: EM | Admit: 2021-03-23 | Discharge: 2021-03-23 | Disposition: A | Discharge status: Home / Self Care | Payer: No Payment, Other | Attending: Psychiatry | Admitting: Psychiatry

## 2021-03-23 ENCOUNTER — Other Ambulatory Visit: Payer: Self-pay

## 2021-03-23 DIAGNOSIS — Z62810 Personal history of physical and sexual abuse in childhood: Secondary | ICD-10-CM | POA: Diagnosis not present

## 2021-03-23 DIAGNOSIS — Z9114 Patient's other noncompliance with medication regimen: Secondary | ICD-10-CM | POA: Insufficient documentation

## 2021-03-23 DIAGNOSIS — F200 Paranoid schizophrenia: Secondary | ICD-10-CM | POA: Insufficient documentation

## 2021-03-23 DIAGNOSIS — R45851 Suicidal ideations: Secondary | ICD-10-CM | POA: Diagnosis not present

## 2021-03-23 DIAGNOSIS — Z560 Unemployment, unspecified: Secondary | ICD-10-CM | POA: Insufficient documentation

## 2021-03-23 DIAGNOSIS — R440 Auditory hallucinations: Secondary | ICD-10-CM

## 2021-03-23 MED ORDER — OLANZAPINE 10 MG PO TABS
20.0000 mg | ORAL_TABLET | Freq: Every day | ORAL | Status: DC
  Filled 2021-03-23: qty 28

## 2021-03-23 MED ORDER — HYDROXYZINE HCL 25 MG PO TABS
25.0000 mg | ORAL_TABLET | Freq: Three times a day (TID) | ORAL | Status: DC | PRN
  Filled 2021-03-23: qty 20

## 2021-03-23 MED ORDER — TRAZODONE HCL 100 MG PO TABS
100.0000 mg | ORAL_TABLET | Freq: Every day | ORAL | Status: DC
  Filled 2021-03-23: qty 14

## 2021-03-23 NOTE — ED Notes (Signed)
Patient alert and oriented, denies SI, HI at time of Discharge. AVS provided with community resources for medication management and therapy. Patient received sample medication with medication education. Patient received all belongings from [facility] locker. Pain level 0/10.

## 2021-03-23 NOTE — Discharge Instructions (Addendum)
Please follow up with [facility] [place] for further management of your mental health.  Return if you have any concerns.

## 2021-03-23 NOTE — ED Notes (Signed)
Breakfast Ordered 

## 2021-03-23 NOTE — ED Provider Notes (Signed)
Behavioral Health Urgent Care Medical Screening Exam  Patient Name: Ashley Vega MRN: [id number] Date of Evaluation: 03/23/21 Chief Complaint:   Diagnosis:  Final diagnoses:  Schizophrenia, paranoid ([place])  Continuous auditory hallucinations    History of Present illness: Ashley Vega is a 30 y.o. female. with a history of schizophrenia-type unspecified presents to the [facility] directly upon discharge from [facility] for worsening auditory hallucinations. Of note, she had a recent admission to the [facility] from 6/20-03/03/21. She states that her hallucinations are always present but have been worsening for the past week, when she ran out of her medications. When she has her medications, they are quieted and bearable for her. Now that she has run out, she hears command hallucinations with the voices telling her "Eat" or "Go kill yourself." She states that she sometimes acts on the commands, particularly when it tells her to eat, and she will overeat at that time. She states that she has current SI (except for during the interview) due to the voices and concerns for her safety, but she has no plan. She says that she feels safest when she is at home with her parents and siblings and that she would like a refill on her prescription and to return home. She currently has appointments scheduled for August 31 and September 12 at the [facility] for therapy and medication management, respectively. She was advised to keep these appointments and walk-in to be seen sooner, to which she was agreeable. She denies HI but affirms VH, stating that she sometimes sees black and white lines, which are not currently visible to her.  Psychiatric Specialty Exam  Presentation  General Appearance:Disheveled (In scrubs from [facility])  Eye Contact:Other (comment) (Intense)  Speech:Clear and Coherent; Slow; Normal Rate (Sometimes slow, otherwise normal rate)  Speech Volume:Normal  Handedness:Right   Mood and Affect   Mood:Euthymic  Affect:Congruent; Restricted   Thought Process  Thought Processes:Goal Directed  Descriptions of Associations:Circumstantial  Orientation:Full (Time, Place and Person)  Thought Content:Paranoid Ideation  Diagnosis of Schizophrenia or Schizoaffective disorder in past: Yes  Duration of Psychotic Symptoms: Greater than six months  Hallucinations:Auditory; Command; Visual "Have a seat." "Are you tired?" "Eat." "Kill yourself." Hears her own voice within and outside of her head White and black lines  Ideas of Reference:Paranoia  Suicidal Thoughts:Yes, Passive Without Intent; Without Plan; With Means to [place]; With Access to Means  Homicidal Thoughts:No   Sensorium  Memory:Immediate Fair; Recent Fair; Remote Fair  Judgment:Fair  Insight:Fair   Executive Functions  Concentration:Fair  Attention Span:Fair  [place] for Improvement; Housing; Resilience; Social Support   Sleep  Sleep:Poor  Number of hours: -3   No data recorded  Physical Exam: Physical Exam Vitals reviewed.  Constitutional:      Appearance: She is obese.  HENT:     Head: Normocephalic and atraumatic.  Eyes:     Extraocular Movements: Extraocular movements intact.  Cardiovascular:     Rate and Rhythm: Normal rate.  Pulmonary:     Effort: Pulmonary effort is normal.  Musculoskeletal:        General: Normal range of motion.     Cervical back: Normal range of motion.  Neurological:     General: No focal deficit present.     Mental Status: She is alert and oriented to person, place, and time.  Psychiatric:        Mood and Affect: Mood  normal.   Review of Systems  Psychiatric/Behavioral:  Positive for depression, hallucinations and suicidal ideas. Negative for memory loss and substance abuse. The patient has insomnia. The patient is not  nervous/anxious.   All other systems reviewed and are negative. Blood pressure 130/88, pulse 86, temperature 98.9 F (37.2 C), temperature source Oral, resp. rate 18, height 5\' 5"  (1.651 m), weight 237 lb (107.5 kg), SpO2 100 %. Body mass index is 39.44 kg/m.  Musculoskeletal: Strength & Muscle Tone: within normal limits Gait & Station: normal Patient leans: N/A   [place] [facility]SE Discharge Disposition for Follow up and Recommendations: Based on my evaluation the patient does not appear to have an emergency medical condition and can be discharged with resources and follow up care in outpatient services for Medication Management and [place], MD 03/23/2021, 4:12 PM
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
## 2021-03-23 NOTE — Discharge Instructions (Addendum)
Patient is instructed prior to discharge to: Take all medications as prescribed by his/her mental healthcare provider. Continue taking Zyprexa 20 mg and Trazodone 100 mg by mouth daily at bedtime. Start Hydroxyzine 25 mg up to three times daily AS NEEDED for sleep or anxiety.   Report any adverse effects and or reactions from the medicines to his/her outpatient provider promptly.  Patient has been instructed & cautioned: To not engage in alcohol and or illegal drug use while on prescription medicines.  In the event of worsening symptoms, patient is instructed to call the crisis hotline, 911 and or go to the nearest ED for appropriate evaluation and treatment of symptoms.  To follow-up with his/her primary care provider for your other medical issues, concerns and or health care needs. Appointments currently scheduled: Therapy @ [facility], [place], 2nd Floor- May 03, 2021 Medication Management with Burt Ek, NP @ [facility], [place], 2nd Floor- May 15, 2021 -Please keep these appointments. -To be seen earlier, please take advantage of walk-in hours. At 7 AM, go to [facility], [place], 2nd Floor- on Friday, 03/24/21 for therapy; on Monday, 7/25, go for medication management.

## 2021-03-23 NOTE — ED Provider Notes (Signed)
Per nursing staff, Patient has been evaluated by psych and was deemed appropriate for discharge as she is psychiatrically cleared.  She will follow-up outpatient for further care.  At this time patient request to be discharged.  She is also medically cleared.  Return precaution given.  BP (!) 137/95 (BP Location: Right Arm)   Pulse 80   Temp (!) 97.1 F (36.2 C)   Resp 16   SpO2 100%   Results for orders placed or performed during the hospital encounter of 03/22/21  Resp Panel by RT-PCR (Flu A&B, Covid) Nasopharyngeal Swab   Specimen: Nasopharyngeal Swab; Nasopharyngeal(NP) swabs in vial transport medium  Result Value Ref Range   SARS Coronavirus 2 by RT PCR NEGATIVE NEGATIVE   Influenza A by PCR NEGATIVE NEGATIVE   Influenza B by PCR NEGATIVE NEGATIVE  Comprehensive metabolic panel  Result Value Ref Range   Sodium 134 (L) 135 - 145 mmol/L   Potassium 3.5 3.5 - 5.1 mmol/L   Chloride 101 98 - 111 mmol/L   CO2 26 22 - 32 mmol/L   Glucose, Bld 111 (H) 70 - 99 mg/dL   BUN 9 6 - 20 mg/dL   Creatinine, Ser 0.87 0.44 - 1.00 mg/dL   Calcium 9.0 8.9 - 10.3 mg/dL   Total Protein 7.4 6.5 - 8.1 g/dL   Albumin 3.5 3.5 - 5.0 g/dL   AST 20 15 - 41 U/L   ALT 19 0 - 44 U/L   Alkaline Phosphatase 73 38 - 126 U/L   Total Bilirubin 0.5 0.3 - 1.2 mg/dL   GFR, Estimated >60 >60 mL/min   Anion gap 7 5 - 15  cbc  Result Value Ref Range   WBC 6.1 4.0 - 10.5 K/uL   RBC 5.08 3.87 - 5.11 MIL/uL   Hemoglobin 10.6 (L) 12.0 - 15.0 g/dL   HCT 36.7 36.0 - 46.0 %   MCV 72.2 (L) 80.0 - 100.0 fL   MCH 20.9 (L) 26.0 - 34.0 pg   MCHC 28.9 (L) 30.0 - 36.0 g/dL   RDW 18.6 (H) 11.5 - 15.5 %   Platelets 371 150 - 400 K/uL   nRBC 0.0 0.0 - 0.2 %  Rapid urine drug screen (hospital performed)  Result Value Ref Range   Opiates NONE DETECTED NONE DETECTED   Cocaine NONE DETECTED NONE DETECTED   Benzodiazepines NONE DETECTED NONE DETECTED   Amphetamines NONE DETECTED NONE DETECTED   Tetrahydrocannabinol NONE  DETECTED NONE DETECTED   Barbiturates NONE DETECTED NONE DETECTED  I-Stat beta hCG blood, ED  Result Value Ref Range   I-stat hCG, quantitative <5.0 <5 mIU/mL   Comment 3           No results found.    Domenic Moras, PA-C 03/23/21 1103    Pattricia Boss, MD 03/23/21 [phone]

## 2021-03-23 NOTE — BH Assessment (Addendum)
TTS triage: Patient presents today from [facility] ED. She is still wearing her purple scrubs. She is calm and cooperative. She states she was told to come here by one of our doctors while on the screen yesterday. She endorses AH x 1 week of voices telling her "You're lame, go home, kill yourself." She states she feels suicidal when these voices happen but not at this time. She denies HI. When asked about VH she states "octopuses and dictionaries." When asked what sort of services she is looking for she states medications.  Patient is urgent.

## 2021-05-03 ENCOUNTER — Ambulatory Visit (HOSPITAL_COMMUNITY): Payer: No Payment, Other | Admitting: Licensed Clinical Social Worker

## 2021-05-15 ENCOUNTER — Ambulatory Visit (HOSPITAL_COMMUNITY): Payer: No Payment, Other | Admitting: Psychiatry

## 2021-06-07 ENCOUNTER — Other Ambulatory Visit: Payer: Self-pay

## 2021-06-07 ENCOUNTER — Ambulatory Visit (INDEPENDENT_AMBULATORY_CARE_PROVIDER_SITE_OTHER): Payer: No Payment, Other | Admitting: Psychiatry

## 2021-06-07 ENCOUNTER — Encounter (HOSPITAL_COMMUNITY): Payer: Self-pay | Admitting: Psychiatry

## 2021-06-07 VITALS — BP 172/99 | HR 78 | Ht 65.0 in | Wt 249.0 lb

## 2021-06-07 DIAGNOSIS — F209 Schizophrenia, unspecified: Secondary | ICD-10-CM

## 2021-06-07 MED ORDER — TRAZODONE HCL 100 MG PO TABS: 100.0000 mg | ORAL_TABLET | Freq: Every day | ORAL | 3 refills | Status: DC

## 2021-06-07 MED ORDER — OLANZAPINE 20 MG PO TABS: 20.0000 mg | ORAL_TABLET | Freq: Every day | ORAL | 3 refills | Status: DC

## 2021-06-07 NOTE — Progress Notes (Signed)
Psychiatric Initial Adult Assessment   Patient Identification: Ashley Vega MRN:  [id number] Date of Evaluation:  06/07/2021 Referral Source: [facility] Chief Complaint:  " My medication help me when I was on them" Chief Complaint   New Patient (Initial Visit)    Visit Diagnosis:    ICD-10-CM   1. Schizophrenia, unspecified type ([place])  F20.9 OLANZapine (ZYPREXA) 20 MG tablet    traZODone (DESYREL) 100 MG tablet      History of Present Illness: 30 year old female seen today for initial psychiatric evaluation.  She was referred to outpatient psychiatry by [facility] where she was seen on 03/23/2021 requesting medication refills.  She was hospitalized at [facility] on 02/20/2021-03/03/2021 for worsening auditory hallucinations.  sSe has psychiatric history of schizophrenia.  She is currently managed on Zyprexa 20 mg nightly and trazodone 100 mg nightly as needed.  She notes her she ran out of her medication in August and notes that they were effective in managing her psychiatric conditions.  Today she is well-groomed, pleasant, cooperative, engaged in conversation, maintained eye contact.  She informed Probation officer that she has been doing well however IT trainer that at times she is anxious and depressed.  She informed Probation officer that her medications were effective when she was taking them.  Patient notes that a major stressor in her life is the death of her mother.  She notes that her mother died in Sep 06, 2020.  She notes that she now lives with her stepfather.  Patient informed writer that at times she is self-conscious about how she looks and relationships that she has with people.  She notes that she tries to put on a brave face and be strong however at times things are difficult.  Provider conducted a GAD-7 and patient scored an 18.  Provider also conducted a PHQ-9 and patient scored a 27.  She notes at times her sleep is poor and notes that she sleeps 2 to 3 hours.  She notes that her appetite has increased and  reports that she is gained 20 pounds since being on Zyprexa.  Patient did seem preoccupied at times (patient shuffle cards throughout the exam). She also smiled inappropriately at times and randomly started calling out drugs names (such as Caplyta, Prilosec, Lexapro).  Provider asked patient if she was experiencing hallucinations and she notes that she was not.  She notes at times it is difficult for her not to speak to herself.  She notes that she does this when she is angry.  Today she denies SI/HI/VAH or paranoia.  Patient endorses symptoms of hypomania such as distractibility, fluctuations in mood, impulsive spending, and irritability.  Patient notes that the death of her mother was traumatic.  She denies flashbacks, nightmares, or avoidant behaviors.  She notes that she tries to tears a time that she has with individuals that she has in her life and her mending relationships that were once broken.  Today she is agreeable to restarting Zyprexa 20 mg and trazodone 100 mg.  She will follow-up in 3 months for further evaluation.  No other concerns at this time. Associated Signs/Symptoms: Depression Symptoms:  depressed mood, anhedonia, insomnia, psychomotor agitation, feelings of worthlessness/guilt, difficulty concentrating, suicidal thoughts without plan, anxiety, weight gain, increased appetite, (Hypo) Manic Symptoms:  Distractibility, Elevated Mood, Financial Extravagance, Impulsivity, Irritable Mood, Anxiety Symptoms:  Excessive Worry, Psychotic Symptoms:   Denies PTSD Symptoms: Had a traumatic exposure:  Notes that the death of her mother in 2019-12-06 was traumatic  Past Psychiatric History: Schizophrenia and  depression. Past hospitalization in June 2022, hospitalization at [facility] in [place] in 2019, and patient notes that she was also hospitalized in 2016  Previous Psychotropic Medications:  Risperdal, Zyprexa and Trazodone  Substance Abuse History in the last 12 months:   No.  Consequences of Substance Abuse: NA  Past Medical History:  Past Medical History:  Diagnosis Date   Fibroids    No pertinent past medical history     Past Surgical History:  Procedure Laterality Date   NO PAST SURGERIES      Family Psychiatric History: Depression mother   Family History:  Family History  Problem Relation Age of Onset   Cancer Maternal Grandmother    Hypertension Maternal Grandfather    Kidney disease Maternal Grandfather    Other Neg Hx     Social History:   Social History   Socioeconomic History   Marital status: Single    Spouse name: Not on file   Number of children: Not on file   Years of education: Not on file   Highest education level: Not on file  Occupational History   Not on file  Tobacco Use   Smoking status: Former    Types: Cigars   Smokeless tobacco: Never  Vaping Use   Vaping Use: Never used  Substance and Sexual Activity   Alcohol use: No   Drug use: No   Sexual activity: Yes    Birth control/protection: None  Other Topics Concern   Not on file  Social History Narrative   Not on file   Social Determinants of Health   Financial Resource Strain: Not on file  Food Insecurity: Not on file  Transportation Needs: Not on file  Physical Activity: Not on file  Stress: Not on file  Social Connections: Not on file    Additional Social History: Patient resides in [place] with her stepfather.  She is single and has no children.  Currently she is unemployed.  She denies alcohol, tobacco, or illegal drug use  Allergies:   Allergies  Allergen Reactions   Peanut-Containing Drug Products Anaphylaxis    Metabolic Disorder Labs: Lab Results  Component Value Date   HGBA1C 6.0 (H) 02/23/2021   MPG 125.5 02/23/2021   No results found for: PROLACTIN Lab Results  Component Value Date   CHOL 151 02/21/2021   TRIG 55 02/21/2021   HDL 35 (L) 02/21/2021   CHOLHDL 4.3 02/21/2021   VLDL 11 02/21/2021   LDLCALC 105 (H)  02/21/2021   Lab Results  Component Value Date   TSH 3.149 02/21/2021    Therapeutic Level Labs: No results found for: LITHIUM No results found for: CBMZ No results found for: VALPROATE  Current Medications: Current Outpatient Medications  Medication Sig Dispense Refill   amLODipine (NORVASC) 10 MG tablet Take 1 tablet (10 mg total) by mouth daily. For high blood pressure (Patient not taking: Reported on 03/22/2021) 30 tablet 0   OLANZapine (ZYPREXA) 20 MG tablet Take 1 tablet (20 mg total) by mouth at bedtime. For mood control 30 tablet 3   traZODone (DESYREL) 100 MG tablet Take 1 tablet (100 mg total) by mouth at bedtime. For sleep 30 tablet 3   No current facility-administered medications for this visit.    Musculoskeletal: Strength & Muscle Tone: within normal limits Gait & Station: normal Patient leans: N/A  Psychiatric Specialty Exam: Review of Systems  Blood pressure (!) 172/99, pulse 78, height 5\' 5"  (1.651 m), weight 249 lb (112.9 kg).Body mass index is 41.44  kg/m.  General Appearance: Well Groomed  Eye Contact:  Good  Speech:  Clear and Coherent and Normal Rate  Volume:  Normal  Mood:  Anxious and Depressed  Affect:  Appropriate and Congruent  Thought Process:  Coherent, Goal Directed, and Linear  Orientation:  Full (Time, Place, and Person)  Thought Content:  WDL and Logical  Suicidal Thoughts:  Yes.  without intent/plan  Homicidal Thoughts:  No  Memory:  Immediate;   Good Recent;   Good Remote;   Good  Judgement:  Fair  Insight:  Good  Psychomotor Activity:  Normal  Concentration:  Concentration: Good and Attention Span: Good  Recall:  Good  Fund of Knowledge:Good  Language: Good  Akathisia:  No  Handed:  Right  AIMS (if indicated):  not done  Assets:  Communication Skills Desire for Improvement Financial Resources/Insurance Housing Leisure Time Physical Health Social Support  ADL's:  Intact  Cognition: WNL  Sleep:  Poor   Screenings: AIMS     Flowsheet Row Admission (Discharged) from 02/20/2021 in [place] Total Score 0      AUDIT    Flowsheet Row Admission (Discharged) from 02/20/2021 in [place] [facility]  Alcohol Use Disorder Identification Test Final Score (AUDIT) 0      GAD-7    Flowsheet Row Office Visit from 06/07/2021 in [facility]  Total GAD-7 Score 18      PHQ2-9    [place] Office Visit from 06/07/2021 in [facility] ED from 02/20/2021 in [place] Office Visit from 02/06/2013 in [place]  PHQ-2 Total Score 6 2 0  PHQ-9 Total Score 27 6 --      [place] Office Visit from 06/07/2021 in [facility] ED from 03/22/2021 in [place] Admission (Discharged) from 02/20/2021 in [place] [facility]  C-SSRS RISK CATEGORY Error: Q7 should not be populated when Q6 is No No Risk No Risk       Assessment and Plan: Patient endorses symptoms of anxiety, depression, insomnia.  She notes that she has been out of her medication since August.  At this time she denies symptoms of psychosis however seems preoccupied and disorganized at times.  Today she is agreeable to restarting Zyprexa 20 mg and trazodone 100 mg nightly as needed.  1. Schizophrenia, unspecified type ([place])  Restart- OLANZapine (ZYPREXA) 20 MG tablet; Take 1 tablet (20 mg total) by mouth at bedtime. For mood control  Dispense: 30 tablet; Refill: 3 Restart- traZODone (DESYREL) 100 MG tablet; Take 1 tablet (100 mg total) by mouth at bedtime. For sleep  Dispense: 30 tablet; Refill: 3  Follow-up in 3 months  Salley Slaughter, NP 10/5/20221:54 PM

## 2021-06-21 IMAGING — CT CT HEAD W/O CM
4 series · 16 of 47 positions shown, 18 images · non-contrast
Comparison: None.

CLINICAL DATA: 29-year-old female with persistent headache for 2
weeks following the death of her mother.

EXAM:
CT HEAD WITHOUT CONTRAST
TECHNIQUE: Contiguous axial images were obtained from the base of the skull
through the vertex without intravenous contrast.

[Series 3: head without · axial · non-contrast · 0.43mm/px · z∈[-33,+87]mm · 7 of 32 slices shown, 9 images]
[im 4/32  brain]
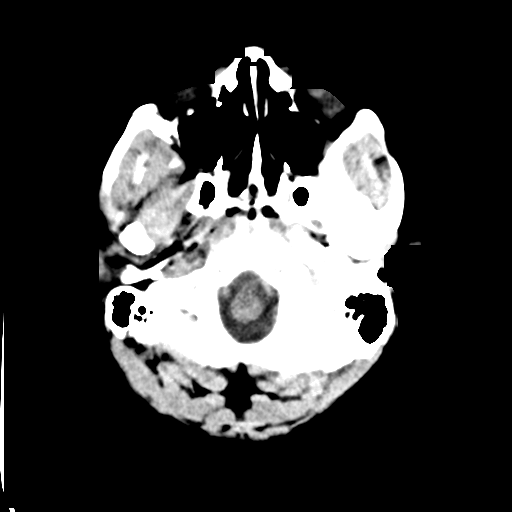
[im 4/32  bone]
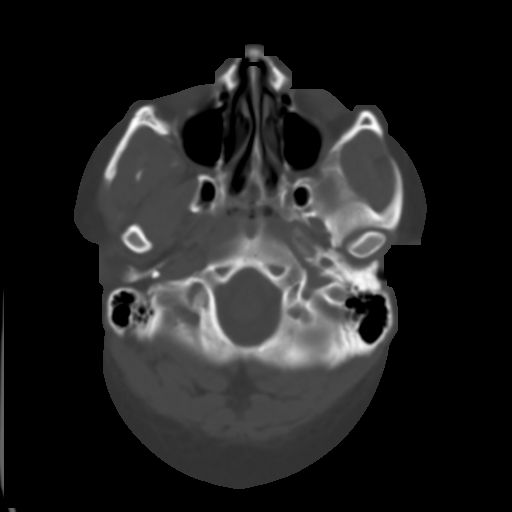
[im 8/32  brain]
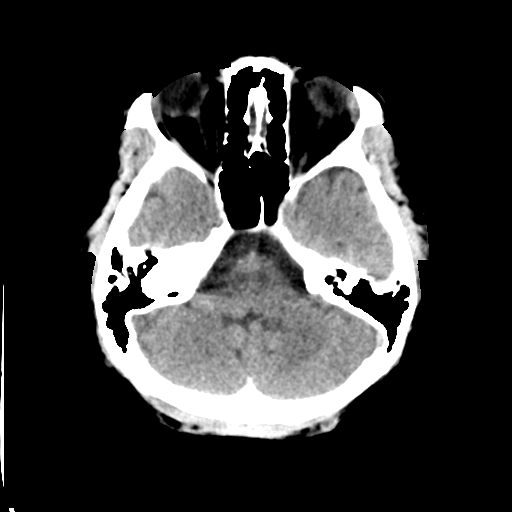
[im 12/32  brain]
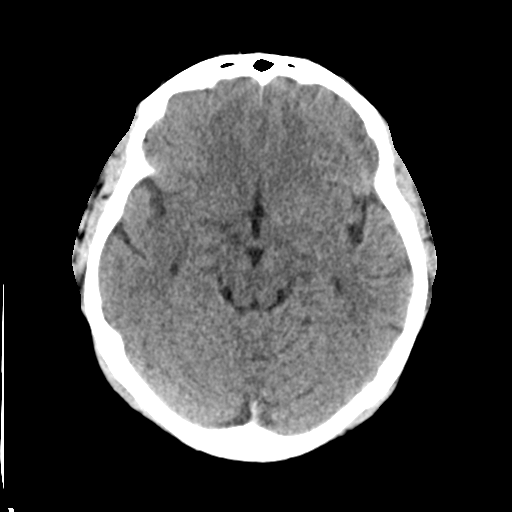
[im 16/32  brain]
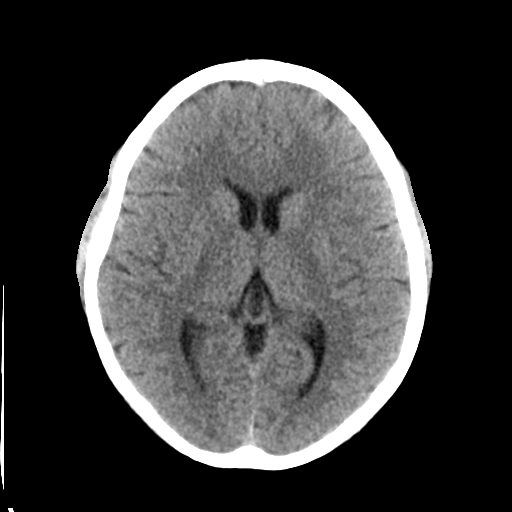
[im 20/32  brain]
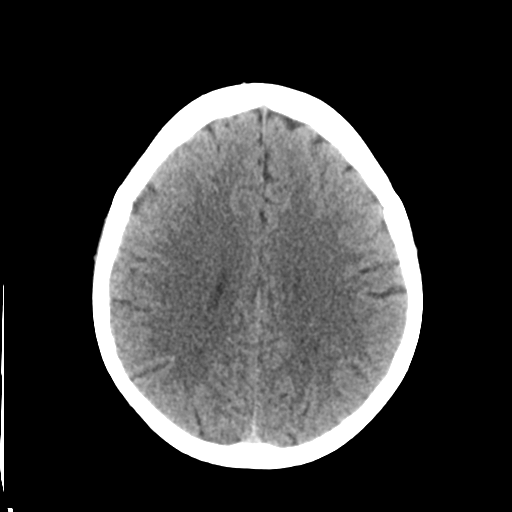
[im 20/32  bone]
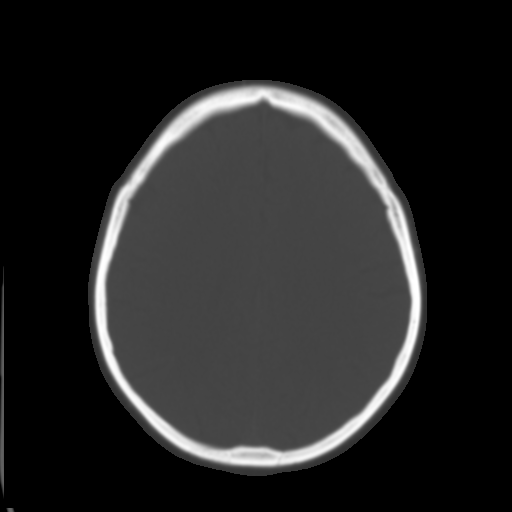
[im 24/32  brain]
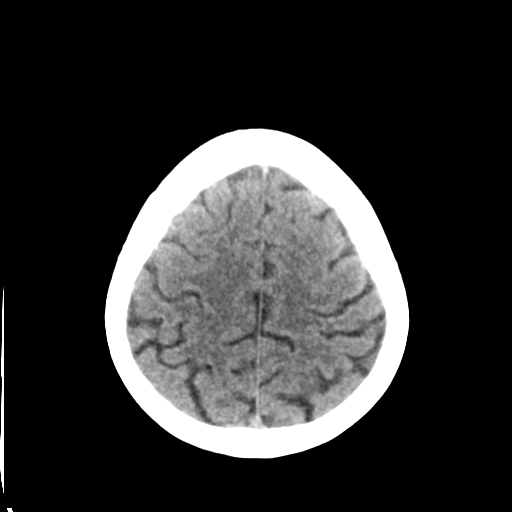
[im 28/32  brain]
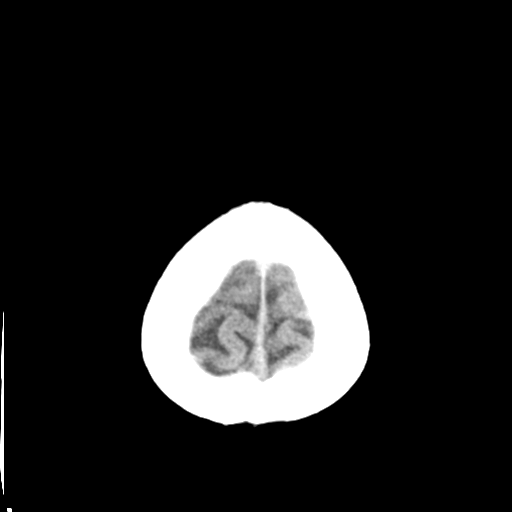

[Series 4: head bone · axial · 0.43mm/px · z∈[-34,-2]mm · 3 of 78 slices shown]
[im 8/78  bone]
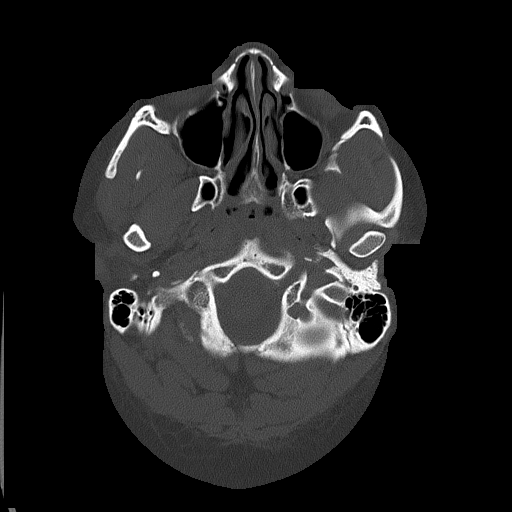
[im 16/78  bone]
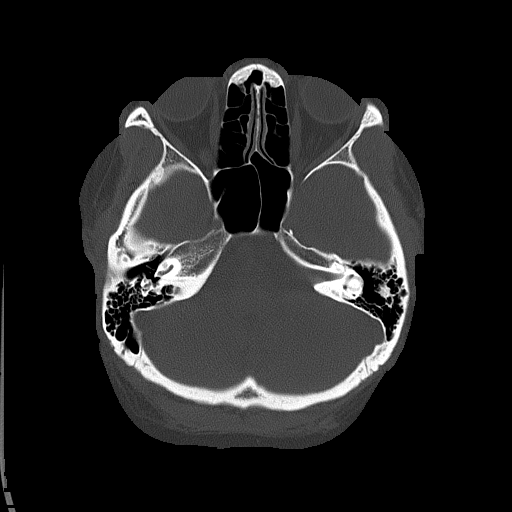
[im 24/78  bone]
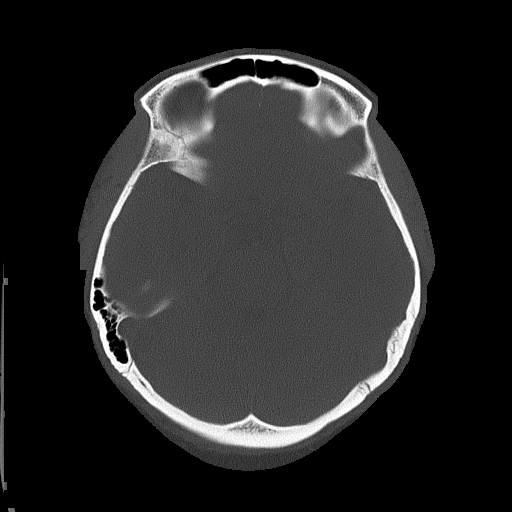

[Series 5: head without cor · coronal · non-contrast · 0.30mm/px · 3 of 67 slices shown]
[im 23/67  brain]
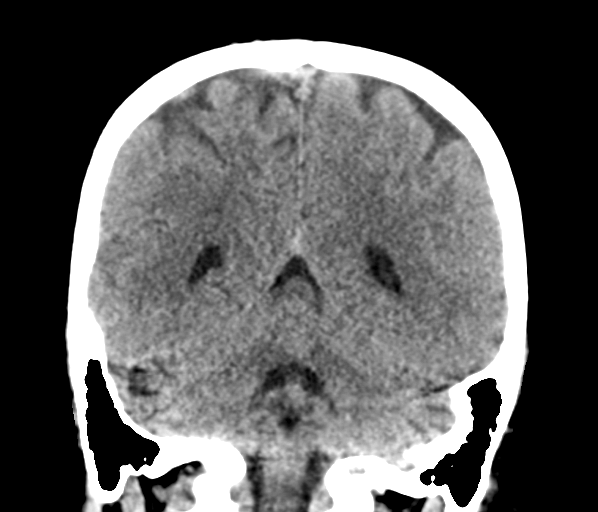
[im 30/67  brain]
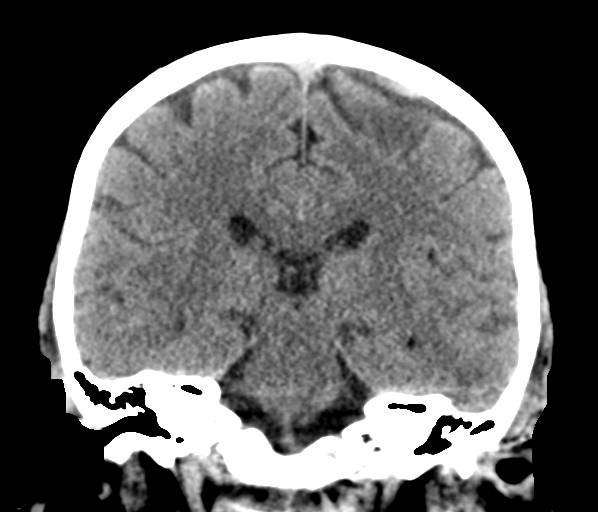
[im 37/67  brain]
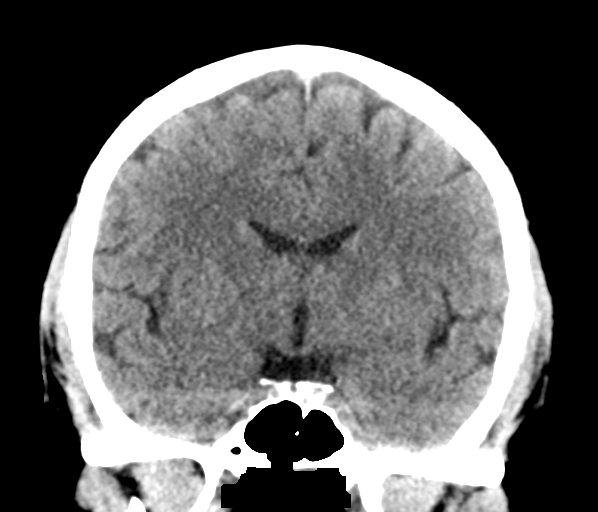

[Series 6: head without sag · sagittal · non-contrast · 0.30mm/px · 3 of 60 slices shown]
[im 20/60  brain]
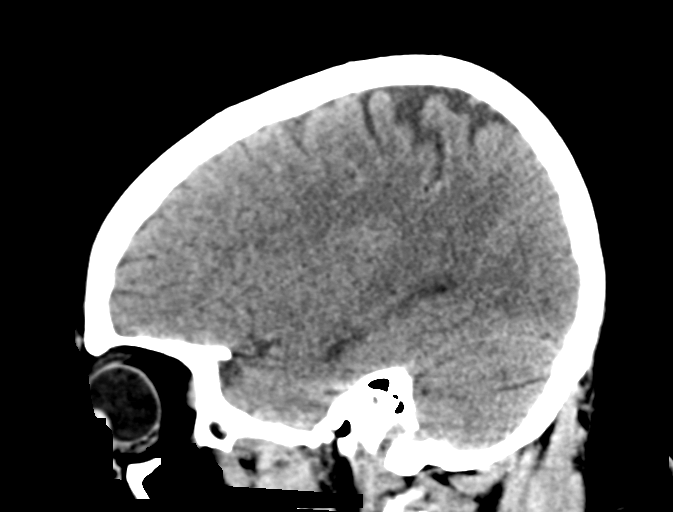
[im 30/60  brain]
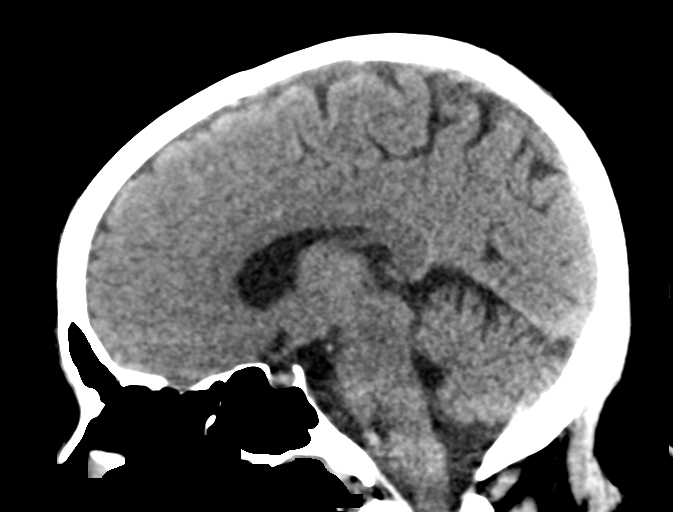
[im 40/60  brain]
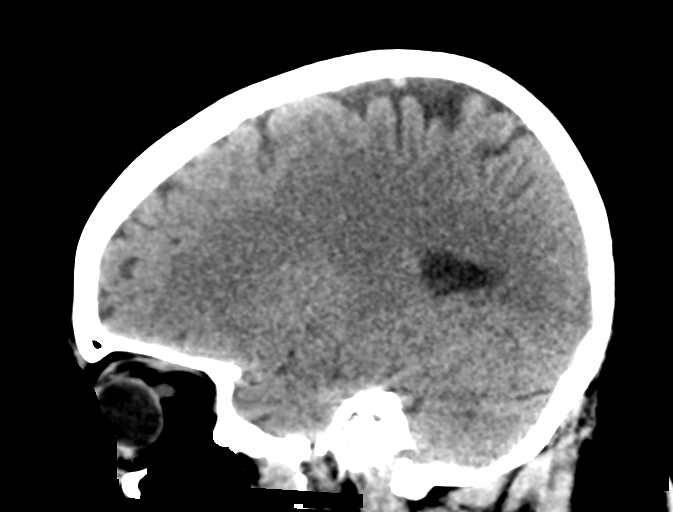

[16 of 47 positions shown; findings below may reference images not displayed]

FINDINGS: Brain: Cerebral volume is within normal limits. No midline shift,
ventriculomegaly, mass effect, evidence of mass lesion, intracranial
hemorrhage or evidence of cortically based acute infarction.
Gray-white matter differentiation is within normal limits throughout
the brain.

Vascular: No suspicious intracranial vascular hyperdensity.

Skull: Negative.

Sinuses/Orbits: Visualized paranasal sinuses and mastoids are clear.

Other: Visualized orbits and scalp soft tissues are within normal
limits.
IMPRESSION: Normal noncontrast Head CT.

## 2021-06-28 ENCOUNTER — Other Ambulatory Visit (HOSPITAL_COMMUNITY): Payer: Self-pay | Admitting: Psychiatry

## 2021-06-28 ENCOUNTER — Telehealth (HOSPITAL_COMMUNITY): Payer: Self-pay | Admitting: Psychiatry

## 2021-06-28 ENCOUNTER — Other Ambulatory Visit: Payer: Self-pay

## 2021-06-28 DIAGNOSIS — F209 Schizophrenia, unspecified: Secondary | ICD-10-CM

## 2021-06-28 MED ORDER — TRAZODONE HCL 100 MG PO TABS
100.0000 mg | ORAL_TABLET | Freq: Every day | ORAL | 3 refills | Status: AC
  Filled 2021-06-28: qty 30, 30d supply, fill #0

## 2021-06-28 MED ORDER — OLANZAPINE 20 MG PO TABS
20.0000 mg | ORAL_TABLET | Freq: Every day | ORAL | 3 refills | Status: AC
  Filled 2021-06-28: qty 30, 30d supply, fill #0

## 2021-06-28 MED ORDER — AMLODIPINE BESYLATE 10 MG PO TABS
10.0000 mg | ORAL_TABLET | Freq: Every day | ORAL | 0 refills | Status: AC
  Filled 2021-06-28: qty 30, 30d supply, fill #0

## 2021-06-28 NOTE — Telephone Encounter (Signed)
Patient presented in office requesting for medications to be refilled. Patient states she wants to pick them up here because her meds cost too much at [facility]. Writer informs patient of [facility] as a pharmacy option and she is agreeable. Patient request for medications to be sent to [facility].  OLANZapine (ZYPREXA) 20 MG tablet traZODone (DESYREL) 100 MG tablet hydrOXYzine (ATARAX/VISTARIL) tablet 25 mg   [[id number]]

## 2021-06-28 NOTE — Telephone Encounter (Signed)
Medication refilled and sent to preferred pharmacy

## 2021-07-03 ENCOUNTER — Other Ambulatory Visit: Payer: Self-pay

## 2021-07-05 ENCOUNTER — Other Ambulatory Visit: Payer: Self-pay

## 2021-08-16 ENCOUNTER — Encounter: Payer: Self-pay | Admitting: Certified Nurse Midwife

## 2021-09-07 ENCOUNTER — Encounter (HOSPITAL_COMMUNITY): Payer: No Payment, Other | Admitting: Psychiatry

## 2021-10-03 ENCOUNTER — Telehealth (HOSPITAL_COMMUNITY): Payer: No Payment, Other | Admitting: Psychiatry

## 2021-10-03 ENCOUNTER — Encounter (HOSPITAL_COMMUNITY): Payer: Self-pay

## 2023-01-09 DIAGNOSIS — L239 Allergic contact dermatitis, unspecified cause: Secondary | ICD-10-CM | POA: Diagnosis not present

## 2023-02-27 ENCOUNTER — Other Ambulatory Visit: Payer: Self-pay

## 2023-02-27 DIAGNOSIS — G4710 Hypersomnia, unspecified: Secondary | ICD-10-CM

## 2023-03-05 ENCOUNTER — Ambulatory Visit (HOSPITAL_BASED_OUTPATIENT_CLINIC_OR_DEPARTMENT_OTHER): Payer: 59 | Attending: Family Medicine | Admitting: Internal Medicine

## 2023-03-05 DIAGNOSIS — G4710 Hypersomnia, unspecified: Secondary | ICD-10-CM
